# Patient Record
Sex: Female | Born: 2000 | Race: Black or African American | Hispanic: No | Marital: Single | Smoking: Never smoker
Health system: Southern US, Community
[De-identification: ages and names within clinical notes are randomized; demographics above are authoritative.]

## PROBLEM LIST (undated history)

## (undated) DIAGNOSIS — L732 Hidradenitis suppurativa: Secondary | ICD-10-CM

## (undated) DIAGNOSIS — F32A Depression, unspecified: Secondary | ICD-10-CM

## (undated) HISTORY — DX: Depression, unspecified: F32.A

## (undated) HISTORY — DX: Hidradenitis suppurativa: L73.2

---
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
## 2022-10-22 ENCOUNTER — Ambulatory Visit: Payer: 59 | Admitting: Nurse Practitioner

## 2022-10-22 ENCOUNTER — Encounter: Payer: Self-pay | Admitting: Nurse Practitioner

## 2022-10-22 VITALS — BP 114/76 | HR 83 | Temp 98.9°F | Resp 14 | Ht 63.5 in | Wt 178.4 lb

## 2022-10-22 DIAGNOSIS — F331 Major depressive disorder, recurrent, moderate: Secondary | ICD-10-CM | POA: Insufficient documentation

## 2022-10-22 DIAGNOSIS — N946 Dysmenorrhea, unspecified: Secondary | ICD-10-CM

## 2022-10-22 DIAGNOSIS — Z13220 Encounter for screening for lipoid disorders: Secondary | ICD-10-CM

## 2022-10-22 DIAGNOSIS — Z842 Family history of other diseases of the genitourinary system: Secondary | ICD-10-CM | POA: Insufficient documentation

## 2022-10-22 DIAGNOSIS — N926 Irregular menstruation, unspecified: Secondary | ICD-10-CM

## 2022-10-22 DIAGNOSIS — Z114 Encounter for screening for human immunodeficiency virus [HIV]: Secondary | ICD-10-CM

## 2022-10-22 DIAGNOSIS — F419 Anxiety disorder, unspecified: Secondary | ICD-10-CM | POA: Diagnosis not present

## 2022-10-22 DIAGNOSIS — L732 Hidradenitis suppurativa: Secondary | ICD-10-CM | POA: Insufficient documentation

## 2022-10-22 DIAGNOSIS — Z7689 Persons encountering health services in other specified circumstances: Secondary | ICD-10-CM

## 2022-10-22 DIAGNOSIS — Z131 Encounter for screening for diabetes mellitus: Secondary | ICD-10-CM | POA: Diagnosis not present

## 2022-10-22 DIAGNOSIS — Z1159 Encounter for screening for other viral diseases: Secondary | ICD-10-CM

## 2022-10-22 LAB — CBC WITH DIFFERENTIAL/PLATELET
Eosinophils Relative: 1.9 %
MCH: 26.6 pg — ABNORMAL LOW (ref 27.0–33.0)
MCHC: 32.3 g/dL (ref 32.0–36.0)
MCV: 82.5 fL (ref 80.0–100.0)
Monocytes Relative: 7.3 %
RBC: 5.48 10*6/uL — ABNORMAL HIGH (ref 3.80–5.10)
Total Lymphocyte: 29 %

## 2022-10-22 MED ORDER — ESCITALOPRAM OXALATE 10 MG PO TABS: 10.0000 mg | ORAL_TABLET | Freq: Every day | ORAL | 0 refills | Status: DC

## 2022-10-22 NOTE — Assessment & Plan Note (Signed)
Getting labs to start. Can discuss OCP or gyn referral after results.

## 2022-10-22 NOTE — Assessment & Plan Note (Signed)
Start lexapro 10 mg daily,  follow up in 4 weeks,  referral placed to psychiatry 

## 2022-10-22 NOTE — Assessment & Plan Note (Signed)
Getting labs to start. Can discuss OCP or gyn referral after results.  

## 2022-10-22 NOTE — Assessment & Plan Note (Signed)
Start lexapro 10 mg daily,  follow up in 4 weeks,  referral placed to psychiatry

## 2022-10-22 NOTE — Progress Notes (Signed)
BP 114/76   Pulse 83   Temp 98.9 F (37.2 C)   Resp 14   Ht 5' 3.5" (1.613 m)   Wt 178 lb 6.4 oz (80.9 kg)   LMP 10/17/2022   SpO2 100%   BMI 31.11 kg/m    Subjective:    Patient ID: Jessica Guerrero, female    DOB: October 21, 2000, 22 y.o.   MRN: [id number]  HPI: Jessica Guerrero is a 22 y.o. female  Chief Complaint  Patient presents with   Establish Care   Anxiety   Establish care: her last physical was a while ago.  Medical history includes depression, anxiety, HS.  Family history includes PCOS, HTN, DM, HS, COPD, breast cancer, depression, bipolar autism.  Health maintenance due for labs.   Depression/anxiety:  she says she has struggled for years.  She has never been on medication.  She would like to do counseling and medication.  She says she has a family history of mental disorders and would like to be evaluated.  Will place referral to psychiatry.  Will also trial lexapro 10mg  daily.  Follow up in 4 weeks.     10/22/2022    1:25 PM  Depression screen PHQ 2/9  Decreased Interest 3  Down, Depressed, Hopeless 2  PHQ - 2 Score 5  Altered sleeping 2  Tired, decreased energy 2  Change in appetite 3  Feeling bad or failure about yourself  2  Trouble concentrating 3  Moving slowly or fidgety/restless 1  Suicidal thoughts 0  PHQ-9 Score 18  Difficult doing work/chores Somewhat difficult       10/22/2022    1:27 PM  GAD 7 : Generalized Anxiety Score  Nervous, Anxious, on Edge 3  Control/stop worrying 2  Worry too much - different things 2  Trouble relaxing 2  Restless 1  Easily annoyed or irritable 3  Afraid - awful might happen 2  Total GAD 7 Score 15  Anxiety Difficulty Somewhat difficult   HS:  she has a family history of HS.  She says that she gets recurrent abscesses in her groin and arm pits.  Will refer to dermatology.    Family history of PCOS/ irregular periods/dysmenorrhea: she says she has irregular and painful periods.  [facility]; 10/17/2022. She  has a family history of PCOS and would like to be tested for it. Will get labs.    Relevant past medical, surgical, family and social history reviewed and updated as indicated. Interim medical history since our last visit reviewed. Allergies and medications reviewed and updated.  Review of Systems  Constitutional: Negative for fever or weight change.  Respiratory: Negative for cough and shortness of breath.   Cardiovascular: Negative for chest pain or palpitations.  Gastrointestinal: Negative for abdominal pain, no bowel changes.  Musculoskeletal: Negative for gait problem or joint swelling.  Skin: Negative for rash.  Neurological: Negative for dizziness or headache.  No other specific complaints in a complete review of systems (except as listed in HPI above).      Objective:    BP 114/76   Pulse 83   Temp 98.9 F (37.2 C)   Resp 14   Ht 5' 3.5" (1.613 m)   Wt 178 lb 6.4 oz (80.9 kg)   LMP 10/17/2022   SpO2 100%   BMI 31.11 kg/m   Wt Readings from Last 3 Encounters:  10/22/22 178 lb 6.4 oz (80.9 kg) (94 %, Z= 1.58)*   * Growth percentiles are based on CDC (Girls,  2-20 Years) data.    Physical Exam  Constitutional: Patient appears well-developed and well-nourished. Obese  No distress.  HEENT: head atraumatic, normocephalic, pupils equal and reactive to light, neck supple Cardiovascular: Normal rate, regular rhythm and normal heart sounds.  No murmur heard. No BLE edema. Pulmonary/Chest: Effort normal and breath sounds normal. No respiratory distress. Abdominal: Soft.  There is no tenderness. Psychiatric: Patient has a normal mood and affect. behavior is normal. Judgment and thought content normal.  Results for orders placed or performed during the hospital encounter of 11/17/08  Rapid strep screen  Result Value Ref Range   Streptococcus, Group A Screen (Direct) NEGATIVE NEGATIVE  Urine culture   Specimen: Urine, Random  Result Value Ref Range   Specimen Description  URINE, RANDOM    Special Requests NONE    Colony Count 25,000 COLONIES/ML    Culture VIRIDANS STREPTOCOCCUS    Report Status 11/20/2008 FINAL   Urinalysis, Routine w reflex microscopic  Result Value Ref Range   Color, Urine YELLOW YELLOW   APPearance CLEAR CLEAR   Specific Gravity, Urine 1.036 (H) 1.005 - 1.030   pH 5.5 5.0 - 8.0   Glucose, UA NEGATIVE NEGATIVE mg/dL   Hgb urine dipstick NEGATIVE NEGATIVE   Bilirubin Urine SMALL (A) NEGATIVE   Ketones, ur 40 (A) NEGATIVE mg/dL   Protein, ur 30 (A) NEGATIVE mg/dL   Urobilinogen, UA 0.2 0.0 - 1.0 mg/dL   Nitrite NEGATIVE NEGATIVE   Leukocytes, UA NEGATIVE NEGATIVE  Urine microscopic-add on  Result Value Ref Range   WBC, UA 0-2 <3 WBC/hpf   RBC / HPF 0-2 <3 RBC/hpf   Bacteria, UA RARE RARE   Urine-Other MUCOUS PRESENT       Assessment & Plan:   Problem List Items Addressed This Visit       Musculoskeletal and Integument   Hidradenitis suppurativa - Primary    Referral placed to dermatology      Relevant Orders   Ambulatory referral to Dermatology     Genitourinary   Dysmenorrhea    Getting labs to start. Can discuss OCP or gyn referral after results.       Relevant Orders   CBC with Differential/Platelet   TSH   FSH/LH   Prolactin   17-Hydroxyprogesterone     Other   Moderate episode of recurrent major depressive disorder    Start lexapro 10 mg daily,  follow up in 4 weeks,  referral placed to psychiatry      Relevant Medications   escitalopram (LEXAPRO) 10 MG tablet   Other Relevant Orders   Ambulatory referral to Psychiatry   Anxiety    Start lexapro 10 mg daily,  follow up in 4 weeks,  referral placed to psychiatry      Relevant Medications   escitalopram (LEXAPRO) 10 MG tablet   Other Relevant Orders   Ambulatory referral to Psychiatry   Family history of PCOS    Getting labs to start. Can discuss OCP or gyn referral after results.       Relevant Orders   TSH   FSH/LH   Prolactin    17-Hydroxyprogesterone   Irregular periods    Getting labs to start. Can discuss OCP or gyn referral after results.       Relevant Orders   CBC with Differential/Platelet   TSH   FSH/LH   Prolactin   17-Hydroxyprogesterone   Other Visit Diagnoses     Screening for diabetes mellitus  Relevant Orders   COMPLETE METABOLIC PANEL WITH GFR   Hemoglobin A1c   Screening for cholesterol level       Relevant Orders   Lipid panel   Encounter for hepatitis C screening test for low risk patient       Relevant Orders   Hepatitis C antibody   Screening for HIV without presence of risk factors       Relevant Orders   HIV Antibody (routine testing w rflx)   Encounter to establish care       schedule cpe        Follow up plan: Return for cpe.

## 2022-10-22 NOTE — Assessment & Plan Note (Signed)
Referral placed to dermatology

## 2022-10-23 LAB — FSH/LH
FSH: 11.3 m[IU]/mL
LH: 5.7 m[IU]/mL

## 2022-10-23 LAB — HIV ANTIBODY (ROUTINE TESTING W REFLEX): HIV 1&2 Ab, 4th Generation: NONREACTIVE

## 2022-10-23 LAB — LIPID PANEL
Cholesterol: 175 mg/dL — ABNORMAL HIGH (ref <170)
LDL Cholesterol (Calc): 97 mg/dL (calc) (ref <110)
Non-HDL Cholesterol (Calc): 113 mg/dL (calc) (ref <120)
Triglycerides: 73 mg/dL (ref <90)

## 2022-10-23 LAB — COMPLETE METABOLIC PANEL WITH GFR
AG Ratio: 1.7 (calc) (ref 1.0–2.5)
Albumin: 4.7 g/dL (ref 3.6–5.1)
Alkaline phosphatase (APISO): 45 U/L (ref 36–128)
Chloride: 106 mmol/L (ref 98–110)
Globulin: 2.8 g/dL (calc) (ref 2.0–3.8)
Glucose, Bld: 71 mg/dL (ref 65–99)
Sodium: 139 mmol/L (ref 135–146)

## 2022-10-23 LAB — CBC WITH DIFFERENTIAL/PLATELET
Absolute Monocytes: 475 cells/uL (ref 200–950)
Basophils Absolute: 72 cells/uL (ref 0–200)
Basophils Relative: 1.1 %
Eosinophils Absolute: 124 cells/uL (ref 15–500)
HCT: 45.2 % — ABNORMAL HIGH (ref 35.0–45.0)
Neutro Abs: 3946 cells/uL (ref 1500–7800)
Platelets: 359 10*3/uL (ref 140–400)
WBC: 6.5 10*3/uL (ref 3.8–10.8)

## 2022-10-23 LAB — PROLACTIN: Prolactin: 12.3 ng/mL

## 2022-10-23 LAB — HEMOGLOBIN A1C: eAG (mmol/L): 5.7 mmol/L

## 2022-10-23 LAB — HEPATITIS C ANTIBODY: Hepatitis C Ab: NONREACTIVE

## 2022-10-26 LAB — CBC WITH DIFFERENTIAL/PLATELET
Hemoglobin: 14.6 g/dL (ref 11.7–15.5)
Lymphs Abs: 1885 cells/uL (ref 850–3900)
MPV: 11.3 fL (ref 7.5–12.5)
Neutrophils Relative %: 60.7 %
RDW: 12.2 % (ref 11.0–15.0)

## 2022-10-26 LAB — LIPID PANEL
HDL: 62 mg/dL (ref >45)
Total CHOL/HDL Ratio: 2.8 (calc) (ref <5.0)

## 2022-10-26 LAB — COMPLETE METABOLIC PANEL WITH GFR
ALT: 15 U/L (ref 5–32)
AST: 16 U/L (ref 12–32)
BUN: 11 mg/dL (ref 7–20)
CO2: 24 mmol/L (ref 20–32)
Calcium: 9.8 mg/dL (ref 8.9–10.4)
Creat: 0.78 mg/dL (ref 0.50–0.96)
Potassium: 4.2 mmol/L (ref 3.8–5.1)
Total Bilirubin: 0.4 mg/dL (ref 0.2–1.1)
Total Protein: 7.5 g/dL (ref 6.3–8.2)
eGFR: 112 mL/min/{1.73_m2} (ref >=60)

## 2022-10-26 LAB — 17-HYDROXYPROGESTERONE: 17-OH-Progesterone, LC/MS/MS: 27 ng/dL

## 2022-10-26 LAB — HEMOGLOBIN A1C
Hgb A1c MFr Bld: 5.2 % of total Hgb (ref <5.7)
Mean Plasma Glucose: 103 mg/dL

## 2022-10-26 LAB — TSH: TSH: 0.8 mIU/L

## 2022-10-27 ENCOUNTER — Telehealth: Payer: Self-pay | Admitting: *Deleted

## 2022-10-27 NOTE — Telephone Encounter (Signed)
  Chief Complaint: Results Symptoms: NA Frequency: NA Pertinent Negatives: Patient denies NA Disposition: [] ED /[] Urgent Care (no appt availability in office) / Appointment(In office/virtual)/  [facility] [facility]/ Home Care/ Refused Recommended Disposition /[] [place] [facility]/  Follow-u g.   "[place], counts, kidney and liver function are normal.  Your cholesterol looks good.  Your A1c is 6 for diabetes is normal.  Your screening for hepatitis C and HIV were negative.  Your thyroid is normal all your hormones came back normal. "

## 2022-11-01 NOTE — Progress Notes (Unsigned)
Name: Jessica Guerrero   MRN: [id number]    DOB: 2001-03-23   Date:11/02/2022       Progress Note  Subjective  Chief Complaint  Chief Complaint  Patient presents with   Annual Exam    HPI  Patient presents for annual CPE.  Diet: she says her diet is dependent on time.  She does not eat red meat, fish, or pork.  Exercise: she says that she is not very active.  She does have a physical job.  Discussed increasing physical activity to 150 min a week.   Sleep: 6 hours Last dental exam:unknown Last eye exam: unknown  Flowsheet Row Office Visit from 11/02/20[facility] in [facility]  AUDIT-C Score 0      Depression: Phq 9 is  positive, she was recently started on Lexapro.     11/02/2022    1:33 PM 10/22/2022    1:25 PM  Depression screen PHQ 2/9  Decreased Interest 2 3  Down, Depressed, Hopeless 2 2  PHQ - 2 Score 4 5  Altered sleeping 3 2  Tired, decreased energy 3 2  Change in appetite 2 3  Feeling bad or failure about yourself  1 2  Trouble concentrating 3 3  Moving slowly or fidgety/restless 0 1  Suicidal thoughts 0 0  PHQ-9 Score 16 18  Difficult doing work/chores Very difficult Somewhat difficult   Hypertension: BP Readings from Last 3 Encounters:  11/02/22 120/68  10/22/22 114/76   Obesity: Wt Readings from Last 3 Encounters:  11/02/22 183 lb 4.8 oz (83.1 kg) (95 %, Z= 1.67)*  10/22/22 178 lb 6.4 oz (80.9 kg) (94 %, Z= 1.58)*   * Growth percentiles are based on CDC (Girls, 2-20 Years) data.   BMI Readings from Last 3 Encounters:  11/02/22 31.96 kg/m (95 %, Z= 1.67)*  10/22/22 31.11 kg/m (95 %, Z= 1.62)*   * Growth percentiles are based on CDC (Girls, 2-20 Years) data.     Vaccines:  HPV: up to at age 37 , ask insurance if age between 63-45  Shingrix: 26-64 yo and ask insurance if covered when patient above 57 yo Pneumonia:  educated and discussed with patient. Flu:  educated and discussed with patient.  Hep C Screening:  10/22/2022 STD testing and prevention (HIV/chl/gon/syphilis): 10/22/2022 Intimate partner violence:none Sexual History :not currently sexually active Menstrual History/LMP/Abnormal Bleeding: irregular periods.  Incontinence Symptoms: none  Breast cancer:  - Last Mammogram: no concerns,does not qualify - BRCA gene screening: unknown,  grandmother on moms side  Osteoporosis: Discussed high calcium and vitamin D supplementation, weight bearing exercises  Cervical cancer screening: none, not due  Skin cancer: Discussed monitoring for atypical lesions  Colorectal cancer: no concerns, does not qualify   Lung cancer:   Low Dose CT Chest recommended if Age 28-80 years, 20 pack-year currently smoking OR have quit w/in 15years. Patient does not qualify.   ECG: none  Advanced Care Planning: A voluntary discussion about advance care planning including the explanation and discussion of advance directives.  Discussed health care proxy and Living will, and the patient was able to identify a health care proxy as thinking about it.  Patient does not have a living will at present time. If patient does have living will, I have requested they bring this to the clinic to be scanned in to their chart.  Lipids: Lab Results  Component Value Date   CHOL 175 (H) 10/22/2022   Lab Results  Component Value Date  HDL 62 10/22/2022   Lab Results  Component Value Date   LDLCALC 97 10/22/2022   Lab Results  Component Value Date   TRIG 73 10/22/2022   Lab Results  Component Value Date   CHOLHDL 2.8 10/22/2022    Glucose: Glucose, Bld  Date Value Ref Range Status  10/22/2022 71 65 - 99 mg/dL Final    Comment:    .            Fasting reference interval .     Patient Active Problem List   Diagnosis Date Noted   Hidradenitis suppurativa 10/22/2022   Moderate episode of recurrent major depressive disorder (HCC) 10/22/2022   Anxiety 10/22/2022   Family history of PCOS 10/22/2022   Dysmenorrhea  10/22/2022   Irregular periods 10/22/2022    No past surgical history on file.  Family History  Problem Relation Age of Onset   Diabetes Mother    Bipolar disorder Mother    Cirrhosis Mother    Autism Brother    Diabetes Maternal Grandmother    Hyperlipidemia Maternal Grandmother    COPD Paternal Grandmother    Diabetes Paternal Grandmother    Hyperlipidemia Paternal Grandmother    Heart failure Paternal Grandmother     Social History   Socioeconomic History   Marital status: Single    Spouse name: Not on file   Number of children: Not on file   Years of education: Not on file   Highest education level: Not on file  Occupational History   Not on file  Tobacco Use   Smoking status: Never   Smokeless tobacco: Never  Vaping Use   Vaping Use: Every day   Substances: Nicotine, Flavoring  Substance and Sexual Activity   Alcohol use: Never   Drug use: Yes    Types: Marijuana   Sexual activity: Not Currently  Other Topics Concern   Not on file  Social History Narrative   Not on file   Social Determinants of Health   Financial Resource Strain: Low Risk  (11/02/2022)   Overall Financial Resource Strain (CARDIA)    Difficulty of Paying Living Expenses: Not hard at all  Food Insecurity: No Food Insecurity (11/02/2022)   Hunger Vital Sign    Worried About Running Out of Food in the Last Year: Never true    Ran Out of Food in the Last Year: Never true  Transportation Needs: No Transportation Needs (11/02/2022)   PRAPARE - Administrator, Civil Service (Medical): No    Lack of Transportation (Non-Medical): No  Physical Activity: Inactive (11/02/2022)   Exercise Vital Sign    Days of Exercise per Week: 0 days    Minutes of Exercise per Session: 0 min  Stress: Stress Concern Present (11/02/2022)   [employer] of Occupational Health - Occupational Stress Questionnaire    Feeling of Stress : Very much  Social Connections: Socially Isolated (11/02/2022)    Social Connection and Isolation Panel [NHANES]    Frequency of Communication with Friends and Family: More than three times a week    Frequency of Social Gatherings with Friends and Family: More than three times a week    Attends Religious Services: Never    Database administrator or Organizations: No    Attends Banker Meetings: Never    Marital Status: Separated  Intimate Partner Violence: Not At Risk (11/02/2022)   Humiliation, Afraid, Rape, and Kick questionnaire    Fear of Current or Ex-Partner: No  Emotionally Abused: No    Physically Abused: No    Sexually Abused: No     Current Outpatient Medications:    escitalopram (LEXAPRO) 10 MG tablet, Take 1 tablet (10 mg total) by mouth daily., Disp: 30 tablet, Rfl: 0  No Known Allergies   ROS  Constitutional: Negative for fever or weight change.  Respiratory: Negative for cough and shortness of breath.   Cardiovascular: Negative for chest pain or palpitations.  Gastrointestinal: Negative for abdominal pain, no bowel changes.  Musculoskeletal: Negative for gait problem or joint swelling.  Skin: Negative for rash.  Neurological: Negative for dizziness or headache.  No other specific complaints in a complete review of systems (except as listed in HPI above).   Objective  Vitals:   11/02/22 1320  BP: 120/68  Pulse: 89  Resp: 18  Temp: 98.2 F (36.8 C)  TempSrc: Oral  SpO2: 98%  Weight: 183 lb 4.8 oz (83.1 kg)  Height: 5' 3.5" (1.613 m)    Body mass index is 31.96 kg/m.  Physical Exam Constitutional: Patient appears well-developed and well-nourished. No distress.  HENT: Head: Normocephalic and atraumatic. Ears: B TMs ok, no erythema or effusion; Nose: Nose normal. Mouth/Throat: Oropharynx is clear and moist. No oropharyngeal exudate.  Eyes: Conjunctivae and EOM are normal. Pupils are equal, round, and reactive to light. No scleral icterus.  Neck: Normal range of motion. Neck supple. No JVD present. No  thyromegaly present.  Cardiovascular: Normal rate, regular rhythm and normal heart sounds.  No murmur heard. No BLE edema. Pulmonary/Chest: Effort normal and breath sounds normal. No respiratory distress. Abdominal: Soft. Bowel sounds are normal, no distension. There is no tenderness. no masses Breast: no lumps or masses, no nipple discharge or rashes Musculoskeletal: Normal range of motion, no joint effusions. No gross deformities Neurological: he is alert and oriented to person, place, and time. No cranial nerve deficit. Coordination, balance, strength, speech and gait are normal.  Skin: Skin is warm and dry. No rash noted. No erythema.  Psychiatric: Patient has a normal mood and affect. behavior is normal. Judgment and thought content normal.       Fall Risk:    11/02/2022    1:32 PM 10/22/2022    1:25 PM  Fall Risk   Falls in the past year? 0 0  Number falls in past yr: 0 0  Injury with Fall? 0 0     Functional Status Survey: Is the patient deaf or have difficulty hearing?: No Does the patient have difficulty seeing, even when wearing glasses/contacts?: No Does the patient have difficulty concentrating, remembering, or making decisions?: No Does the patient have difficulty walking or climbing stairs?: No Does the patient have difficulty dressing or bathing?: No Does the patient have difficulty doing errands alone such as visiting a doctor's office or shopping?: No   Assessment & Plan  1. Annual physical exam Increase physical activity to 150 min a week Try and eat well balanced diet Recent labs were good  Follow up for depression/anxiety  -USPSTF grade A and B recommendations reviewed with patient; age-appropriate recommendations, preventive care, screening tests, etc discussed and encouraged; healthy living encouraged; see AVS for patient education given to patient -Discussed importance of 150 minutes of physical activity weekly, eat two servings of fish weekly, eat one  serving of tree nuts ( cashews, pistachios, pecans, almonds.Marland Kitchen) every other day, eat 6 servings of fruit/vegetables daily and drink plenty of water and avoid sweet beverages.

## 2022-11-02 ENCOUNTER — Encounter: Payer: Self-pay | Admitting: Nurse Practitioner

## 2022-11-02 ENCOUNTER — Ambulatory Visit (INDEPENDENT_AMBULATORY_CARE_PROVIDER_SITE_OTHER): Payer: 59 | Admitting: Nurse Practitioner

## 2022-11-02 ENCOUNTER — Other Ambulatory Visit: Payer: Self-pay

## 2022-11-02 VITALS — BP 120/68 | HR 89 | Temp 98.2°F | Resp 18 | Ht 63.5 in | Wt 183.3 lb

## 2022-11-02 DIAGNOSIS — Z0000 Encounter for general adult medical examination without abnormal findings: Secondary | ICD-10-CM | POA: Diagnosis not present

## 2022-11-16 ENCOUNTER — Ambulatory Visit: Payer: 59 | Admitting: Nurse Practitioner

## 2022-11-16 ENCOUNTER — Encounter: Payer: Self-pay | Admitting: Nurse Practitioner

## 2022-11-16 VITALS — BP 114/72 | HR 96 | Temp 98.4°F | Resp 14 | Ht 63.5 in | Wt 185.2 lb

## 2022-11-16 DIAGNOSIS — F331 Major depressive disorder, recurrent, moderate: Secondary | ICD-10-CM

## 2022-11-16 DIAGNOSIS — F419 Anxiety disorder, unspecified: Secondary | ICD-10-CM

## 2022-11-16 MED ORDER — ESCITALOPRAM OXALATE 20 MG PO TABS: 20.0000 mg | ORAL_TABLET | Freq: Every day | ORAL | 0 refills | Status: DC

## 2022-11-16 MED ORDER — BUSPIRONE HCL 5 MG PO TABS: 5.0000 mg | ORAL_TABLET | Freq: Three times a day (TID) | ORAL | 0 refills | Status: DC | PRN

## 2022-11-16 NOTE — Assessment & Plan Note (Signed)
Increase lexapro to 20 mg daily,  start buspar 5 mg TID as needed, has appointment with psychiatry next month.

## 2022-11-16 NOTE — Assessment & Plan Note (Signed)
Increase lexapro to 20 mg daily,  start buspar 5 mg TID as needed, has appointment with psychiatry next month.  

## 2022-11-16 NOTE — Progress Notes (Signed)
BP 114/72 (BP Location: Right Arm, Patient Position: Sitting, Cuff Size: Normal)   Pulse 96   Temp 98.4 F (36.9 C) (Oral)   Resp 14   Ht 5' 3.5" (1.613 m) Comment: per chart  Wt 185 lb 3.2 oz (84 kg)   LMP 10/17/2022   SpO2 98%   BMI 32.29 kg/m    Subjective:    Patient ID: Jessica Guerrero, female    DOB: 2000-08-31, 22 y.o.   MRN: [id number]  HPI: Jessica Guerrero is a 22 y.o. female  Chief Complaint  Patient presents with   Follow-up   Depression/anxiety:  she was started on Lexapro 10 mg daily. Referral to psychiatry was placed at an earlier appointment, she has an appointment on 12/29/2022.  She reports that she has noticed a difference, she says she is not where she wants to be and would like to increase the dose.  Her PHQ9 and GAD scores are improving but not at goal. Will increase the dose to 20 mg daily.  She also has noticed that her anxiety has been worse or more noticeable. Will start buspar 5 mg TID as needed.     11/16/2022    2:23 PM 11/02/2022    1:33 PM 10/22/2022    1:25 PM  Depression screen PHQ 2/9  Decreased Interest 2 2 3   Down, Depressed, Hopeless 2 2 2   PHQ - 2 Score 4 4 5   Altered sleeping 1 3 2   Tired, decreased energy 2 3 2   Change in appetite 3 2 3   Feeling bad or failure about yourself  1 1 2   Trouble concentrating 1 3 3   Moving slowly or fidgety/restless 0 0 1  Suicidal thoughts 0 0 0  PHQ-9 Score 12 16 18   Difficult doing work/chores Somewhat difficult Very difficult Somewhat difficult       11/16/2022    2:23 PM 11/02/2022    1:35 PM 10/22/2022    1:27 PM  GAD 7 : Generalized Anxiety Score  Nervous, Anxious, on Edge 2 2 3   Control/stop worrying 1 2 2   Worry too much - different things 2 3 2   Trouble relaxing 2 1 2   Restless 1 1 1   Easily annoyed or irritable 2 3 3   Afraid - awful might happen 0 1 2  Total GAD 7 Score 10 13 15   Anxiety Difficulty Somewhat difficult Very difficult Somewhat difficult     Relevant past  medical, surgical, family and social history reviewed and updated as indicated. Interim medical history since our last visit reviewed. Allergies and medications reviewed and updated.  Review of Systems  Constitutional: Negative for fever or weight change.  Respiratory: Negative for cough and shortness of breath.   Cardiovascular: Negative for chest pain or palpitations.  Gastrointestinal: Negative for abdominal pain, no bowel changes.  Musculoskeletal: Negative for gait problem or joint swelling.  Skin: Negative for rash.  Neurological: Negative for dizziness or headache.  No other specific complaints in a complete review of systems (except as listed in HPI above).      Objective:    BP 114/72 (BP Location: Right Arm, Patient Position: Sitting, Cuff Size: Normal)   Pulse 96   Temp 98.4 F (36.9 C) (Oral)   Resp 14   Ht 5' 3.5" (1.613 m) Comment: per chart  Wt 185 lb 3.2 oz (84 kg)   LMP 10/17/2022   SpO2 98%   BMI 32.29 kg/m   Wt Readings from Last 3 Encounters:  11/16/22 185  lb 3.2 oz (84 kg) (96 %, Z= 1.70)*  11/02/22 183 lb 4.8 oz (83.1 kg) (95 %, Z= 1.67)*  10/22/22 178 lb 6.4 oz (80.9 kg) (94 %, Z= 1.58)*   * Growth percentiles are based on CDC (Girls, 2-20 Years) data.    Physical Exam  Constitutional: Patient appears well-developed and well-nourished. Obese  No distress.  HEENT: head atraumatic, normocephalic, pupils equal and reactive to light, neck supple, throat within normal limits Cardiovascular: Normal rate, regular rhythm and normal heart sounds.  No murmur heard. No BLE edema. Pulmonary/Chest: Effort normal and breath sounds normal. No respiratory distress. Abdominal: Soft.  There is no tenderness. Psychiatric: Patient has a normal mood and affect. behavior is normal. Judgment and thought content normal.   Assessment & Plan:   Problem List Items Addressed This Visit       Other   Moderate episode of recurrent major depressive disorder (HCC) - Primary     Increase lexapro to 20 mg daily,  start buspar 5 mg TID as needed, has appointment with psychiatry next month.       Relevant Medications   escitalopram (LEXAPRO) 20 MG tablet   busPIRone (BUSPAR) 5 MG tablet   Anxiety    Increase lexapro to 20 mg daily,  start buspar 5 mg TID as needed, has appointment with psychiatry next month.       Relevant Medications   escitalopram (LEXAPRO) 20 MG tablet   busPIRone (BUSPAR) 5 MG tablet     Follow up plan: Return in about 4 weeks (around 12/14/2022) for follow up.

## 2022-12-13 NOTE — Progress Notes (Unsigned)
   There were no vitals taken for this visit.   Subjective:    Patient ID: Jessica Guerrero, female    DOB: January 13, 2001, 22 y.o.   MRN: [id number]  HPI: Jessica Guerrero is a 22 y.o. female  No chief complaint on file.  Depression/anxiety Medication lexapro 20 mg daily, buspar 5 mg TID Compliant *** Side effects *** PHQ9 *** GAD *** Therapy ***  Psychiatry appointment 12/29/2022      11/16/2022    2:23 PM 11/02/2022    1:33 PM 10/22/2022    1:25 PM  Depression screen PHQ 2/9  Decreased Interest 2 2 3   Down, Depressed, Hopeless 2 2 2   PHQ - 2 Score 4 4 5   Altered sleeping 1 3 2   Tired, decreased energy 2 3 2   Change in appetite 3 2 3   Feeling bad or failure about yourself  1 1 2   Trouble concentrating 1 3 3   Moving slowly or fidgety/restless 0 0 1  Suicidal thoughts 0 0 0  PHQ-9 Score 12 16 18   Difficult doing work/chores Somewhat difficult Very difficult Somewhat difficult       11/16/2022    2:23 PM 11/02/2022    1:35 PM 10/22/2022    1:27 PM  GAD 7 : Generalized Anxiety Score  Nervous, Anxious, on Edge 2 2 3   Control/stop worrying 1 2 2   Worry too much - different things 2 3 2   Trouble relaxing 2 1 2   Restless 1 1 1   Easily annoyed or irritable 2 3 3   Afraid - awful might happen 0 1 2  Total GAD 7 Score 10 13 15   Anxiety Difficulty Somewhat difficult Very difficult Somewhat difficult     Relevant past medical, surgical, family and social history reviewed and updated as indicated. Interim medical history since our last visit reviewed. Allergies and medications reviewed and updated.  Review of Systems  Constitutional: Negative for fever or weight change.  Respiratory: Negative for cough and shortness of breath.   Cardiovascular: Negative for chest pain or palpitations.  Gastrointestinal: Negative for abdominal pain, no bowel changes.  Musculoskeletal: Negative for gait problem or joint swelling.  Skin: Negative for rash.  Neurological: Negative  for dizziness or headache.  No other specific complaints in a complete review of systems (except as listed in HPI above).      Objective:    There were no vitals taken for this visit.  Wt Readings from Last 3 Encounters:  11/16/22 185 lb 3.2 oz (84 kg) (96 %, Z= 1.70)*  11/02/22 183 lb 4.8 oz (83.1 kg) (95 %, Z= 1.67)*  10/22/22 178 lb 6.4 oz (80.9 kg) (94 %, Z= 1.58)*   * Growth percentiles are based on CDC (Girls, 2-20 Years) data.    Physical Exam  Constitutional: Patient appears well-developed and well-nourished. Obese  No distress.  HEENT: head atraumatic, normocephalic, pupils equal and reactive to light, neck supple, throat within normal limits Cardiovascular: Normal rate, regular rhythm and normal heart sounds.  No murmur heard. No BLE edema. Pulmonary/Chest: Effort normal and breath sounds normal. No respiratory distress. Abdominal: Soft.  There is no tenderness. Psychiatric: Patient has a normal mood and affect. behavior is normal. Judgment and thought content normal.   Assessment & Plan:   Problem List Items Addressed This Visit   None    Follow up plan: No follow-ups on file.

## 2022-12-14 ENCOUNTER — Ambulatory Visit: Payer: 59 | Admitting: Nurse Practitioner

## 2022-12-14 ENCOUNTER — Encounter: Payer: Self-pay | Admitting: Nurse Practitioner

## 2022-12-14 ENCOUNTER — Other Ambulatory Visit: Payer: Self-pay

## 2022-12-14 VITALS — BP 122/84 | HR 72 | Temp 97.9°F | Resp 18 | Ht 63.5 in | Wt 187.7 lb

## 2022-12-14 DIAGNOSIS — F331 Major depressive disorder, recurrent, moderate: Secondary | ICD-10-CM

## 2022-12-14 DIAGNOSIS — F419 Anxiety disorder, unspecified: Secondary | ICD-10-CM | POA: Diagnosis not present

## 2022-12-14 MED ORDER — ESCITALOPRAM OXALATE 20 MG PO TABS: 20.0000 mg | ORAL_TABLET | Freq: Every day | ORAL | 0 refills | Status: DC

## 2022-12-14 NOTE — Assessment & Plan Note (Signed)
Continue lexapro 20 mg daily, can increase buspar to 10 mg .  Follow up with psychiatry.

## 2022-12-14 NOTE — Assessment & Plan Note (Signed)
Continue lexapro 20 mg daily,  follow up with psychiatry.

## 2022-12-22 ENCOUNTER — Telehealth: Payer: Self-pay | Admitting: Psychiatry

## 2022-12-22 NOTE — Telephone Encounter (Signed)
Provider out of office. On 6/19 message left on her answering machine to call and reschedule, mychart message sent also

## 2022-12-29 ENCOUNTER — Ambulatory Visit: Payer: Self-pay | Admitting: Psychiatry

## 2023-01-07 ENCOUNTER — Encounter: Payer: Self-pay | Admitting: Psychiatry

## 2023-01-07 ENCOUNTER — Telehealth (INDEPENDENT_AMBULATORY_CARE_PROVIDER_SITE_OTHER): Payer: Self-pay | Admitting: Psychiatry

## 2023-01-07 DIAGNOSIS — Z79899 Other long term (current) drug therapy: Secondary | ICD-10-CM

## 2023-01-07 DIAGNOSIS — F1290 Cannabis use, unspecified, uncomplicated: Secondary | ICD-10-CM

## 2023-01-07 DIAGNOSIS — F39 Unspecified mood [affective] disorder: Secondary | ICD-10-CM

## 2023-01-07 NOTE — Patient Instructions (Addendum)
[email or web address]  www.psychologytoday  [employer] [email or web address] [address] [place], [place] [address]   [phone]  [facility], [email or web address] [address] [place], [place] [address]  [phone]   [facility] - [phone]  [facility] - [phone]  [facility] - [phone] counseling [phone]  [facility] [phone]   [email or web address] this clinician can offer telehealth and has a sliding scale option  [email or web address] this group also offers sliding scale rates and is based out of [place]   [employer] [facility] has interns who offer sliding scale rates and some of the full time clinicians do, as well. You complete their contact form on their website and the referrals coordinator will help to get connected to someone   Medicaid below :  [facility] [address] [place], [place] [address]  [phone]    [employer] [address] [place], [place] [address]  [phone]    [facility] [address] [place], [place] [address]  [phone]     Lamotrigine Tablets What is this medication? LAMOTRIGINE (la MOE Patrecia Pace) prevents and controls seizures in people with epilepsy. It may also be used to treat bipolar disorder. It works by calming overactive nerves in your body. This medicine may be used for other purposes; ask your health care provider or pharmacist if you have questions. COMMON BRAND NAME(S): Lamictal, Subvenite What should I tell my care team before I take this medication? They need to know if you have any of these conditions: Heart disease History of irregular heartbeat Immune system problems Kidney  disease Liver disease Low levels of folic acid in the blood Lupus Mental health condition Suicidal thoughts, plans, or attempt by you or a family member An unusual or allergic reaction to lamotrigine, other medications, foods, dyes, or preservatives Pregnant or trying to get pregnant Breastfeeding How should I use this medication? Take this medication by mouth with a glass of water. Follow the directions on the prescription label. Do not chew these tablets. If this medication upsets your stomach, take it with food or milk. Take your doses at regular intervals. Do not take your medication more often than directed. A special MedGuide will be given to you by the pharmacist with each new prescription and refill. Be sure to read this information carefully each time. Talk to your care team about the use of this medication in children. While this medication may be prescribed for children as young as 2 years for selected conditions, precautions do apply. Overdosage: If you think you have taken too much of this medicine contact a poison control center or emergency room at once. NOTE: This medicine is only for you. Do not share this medicine with others. What if I miss a dose? If you miss a dose, take it as soon as you can. If it is almost time for your next dose, take only that dose. Do not take double or extra doses. What may interact with this medication? Atazanavir Certain medications for irregular heartbeat Certain medications for seizures, such as carbamazepine, phenobarbital, phenytoin, primidone, or valproic acid Estrogen or progestin hormones Lopinavir Rifampin Ritonavir This list may not describe all possible interactions. Give your health care provider a list of all the medicines, herbs, non-prescription drugs, or dietary supplements you use. Also tell them if you smoke, drink  alcohol, or use illegal drugs. Some items may interact with your medicine. What should I watch for while using  this medication? Visit your care team for regular checks on your progress. If you take this medication for seizures, wear a Medic Alert bracelet or necklace. Carry an identification card with information about your condition, medications, and care team. It is important to take this medication exactly as directed. When first starting treatment, your dose will need to be adjusted slowly. It may take weeks or months before your dose is stable. You should contact your care team if your seizures get worse or if you have any new types of seizures. Do not stop taking this medication unless instructed by your care team. Stopping your medication suddenly can increase your seizures or their severity. This medication may cause serious skin reactions. They can happen weeks to months after starting the medication. Contact your care team right away if you notice fevers or flu-like symptoms with a rash. The rash may be red or purple and then turn into blisters or peeling of the skin. You may also notice a red rash with swelling of the face, lips, or lymph nodes in your neck or under your arms. This medication may affect your coordination, reaction time, or judgment. Do not drive or operate machinery until you know how this medication affects you. Sit up or stand slowly to reduce the risk of dizzy or fainting spells. Drinking alcohol with this medication can increase the risk of these side effects. If you are taking this medication for bipolar disorder, it is important to report any changes in your mood to your care team. If your condition gets worse, you get mentally depressed, feel very hyperactive or manic, have difficulty sleeping, or have thoughts of hurting yourself or committing suicide, you need to get help from your care team right away. If you are a caregiver for someone taking this medication for bipolar disorder, you should also report these behavioral changes right away. The use of this medication may increase the  chance of suicidal thoughts or actions. Pay special attention to how you are responding while on this medication. Your mouth may get dry. Chewing sugarless gum or sucking hard candy and drinking plenty of water may help. Contact your care team if the problem does not go away or is severe. If you become pregnant while using this medication, you may enroll in the [place] American Antiepileptic Drug Pregnancy Registry by calling [phone]. This registry collects information about the safety of antiepileptic medication use during pregnancy. This medication may cause a decrease in folic acid. You should make sure that you get enough folic acid while you are taking this medication. Discuss the foods you eat and the vitamins you take with your care team. What side effects may I notice from receiving this medication? Side effects that you should report to your care team as soon as possible: Allergic reactions--skin rash, itching, hives, swelling of the face, lips, tongue, or throat Change in vision Fever, neck pain or stiffness, sensitivity to light, headache, nausea, vomiting, confusion Heart rhythm changes--fast or irregular heartbeat, dizziness, feeling faint or lightheaded, chest pain, trouble breathing Infection--fever, chills, cough, or sore throat Liver injury--right upper belly pain, loss of appetite, nausea, light-colored stool, dark yellow or brown urine, yellowing skin or eyes, unusual weakness or fatigue Low red blood cell count--unusual weakness or fatigue, dizziness, headache, trouble breathing Rash, fever, and swollen lymph nodes Redness, blistering, peeling or loosening of the skin, including inside  the mouth Thoughts of suicide or self-harm, worsening mood, or feelings of depression Unusual bruising or bleeding Side effects that usually do not require medical attention (report to your care team if they continue or are  bothersome): Diarrhea Dizziness Drowsiness Headache Nausea Stomach pain Tremors or shaking This list may not describe all possible side effects. Call your doctor for medical advice about side effects. You may report side effects to FDA at 1-800-FDA-1088. Where should I keep my medication? Keep out of the reach of children and pets. Store at [employer] [facility] (77 degrees F). Protect from light. Get rid of any unused medication after the expiration date. To get rid of medications that are no longer needed or have expired: Take the medication to a medication take-back program. Check with your pharmacy or law enforcement to find a location. If you cannot return the medication, check the label or package insert to see if the medication should be thrown out in the garbage or flushed down the toilet. If you are not sure, ask your care team. If it is safe to put it in the trash, empty the medication out of the container. Mix the medication with cat litter, dirt, coffee grounds, or other unwanted substance. Seal the mixture in a bag or container. Put it in the trash. NOTE: This sheet is a summary. It may not cover all possible information. If you have questions about this medicine, talk to your doctor, pharmacist, or health care provider.  2024 Elsevier/Gold Standard (2021-12-29 00:00:00)
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
## 2023-01-11 ENCOUNTER — Other Ambulatory Visit: Payer: Self-pay | Admitting: Nurse Practitioner

## 2023-01-11 DIAGNOSIS — F419 Anxiety disorder, unspecified: Secondary | ICD-10-CM

## 2023-01-11 DIAGNOSIS — F331 Major depressive disorder, recurrent, moderate: Secondary | ICD-10-CM

## 2023-01-11 NOTE — Telephone Encounter (Signed)
Requested Prescriptions  Pending Prescriptions Disp Refills   escitalopram (LEXAPRO) 20 MG tablet [Pharmacy Med Name: Escitalopram Oxalate 20 MG Oral Tablet] 90 tablet 0    Sig: Take 1 tablet by mouth once daily     Psychiatry:  Antidepressants - SSRI Passed - 01/11/2023  9:21 AM      Passed - Completed PHQ-2 or PHQ-9 in the last 360 days      Passed - Valid encounter within last 6 months    Recent Outpatient Visits           4 weeks ago Moderate episode of recurrent major depressive disorder [facility])   [facility] Della Goo F, FNP   1 month ago Moderate episode of recurrent major depressive disorder [facility])   [facility] Berniece Salines, FNP   2 months ago Annual physical exam   [facility] Berniece Salines, FNP   2 months ago Hidradenitis suppurativa   [facility] Berniece Salines, [place]

## 2023-01-27 ENCOUNTER — Other Ambulatory Visit: Payer: Self-pay | Admitting: Nurse Practitioner

## 2023-01-27 DIAGNOSIS — F419 Anxiety disorder, unspecified: Secondary | ICD-10-CM

## 2023-01-31 MED ORDER — BUSPIRONE HCL 5 MG PO TABS: 10.0000 mg | ORAL_TABLET | Freq: Every day | ORAL | 1 refills | Status: DC

## 2023-02-17 ENCOUNTER — Encounter: Payer: Self-pay | Admitting: Psychiatry

## 2023-02-17 ENCOUNTER — Ambulatory Visit: Payer: 59 | Admitting: Psychiatry

## 2023-02-17 VITALS — BP 123/85 | HR 67 | Temp 97.8°F | Ht 63.5 in | Wt 199.2 lb

## 2023-02-17 DIAGNOSIS — Z9189 Other specified personal risk factors, not elsewhere classified: Secondary | ICD-10-CM | POA: Insufficient documentation

## 2023-02-17 DIAGNOSIS — F1290 Cannabis use, unspecified, uncomplicated: Secondary | ICD-10-CM | POA: Diagnosis not present

## 2023-02-17 DIAGNOSIS — Z79899 Other long term (current) drug therapy: Secondary | ICD-10-CM

## 2023-02-17 DIAGNOSIS — F39 Unspecified mood [affective] disorder: Secondary | ICD-10-CM | POA: Diagnosis not present

## 2023-02-17 NOTE — Progress Notes (Signed)
BH MD OP Progress Note  02/17/2023 2:30 PM Jessica Guerrero  MRN:  [id number]  Chief Complaint:  Chief Complaint  Patient presents with   Follow-up   Depression   Medication Refill   HPI: Jessica Guerrero is a 22 year old African-American female, employed, single, lives in [place], has a history of episodic mood disorder, hidradenitis suppurativa, was evaluated in office today for follow-up.  Patient's last visit was on 01/07/2023.  At that time patient was referred to CBT as well as was advised to get urine drug screen completed prior to initiation of a mood stabilizer.  Patient today returns reporting that she has been noncompliant with recommendations.  She continues to struggle with depression and mood swings.  Patient reports she has struggled with depression all her life and is very 'self-aware' of that.  Patient reports she lacks self hygiene and feels unmotivated to do anything to take care of herself.  Although she continues to work and reports she is able to take care of her work-related chores.  She also reports she takes care of her grandmother and great-grandmother who needs a lot of help since they have multiple medical problems going on.  She does have 'a dad' however he is not very helpful.  She hence stays busy taking her grandmothers for doctor's appointment and other chores that she needs to do around the house.  Patient reports she continues to have mood swings, irritability, feels frustrated often.  She feels 'stuck', unable to elaborate further.  Patient reports she stopped smoking cannabis at least 2 weeks ago.  Patient denies any suicidality, homicidality or perceptual disturbances.  Patient reports she is agreeable to trial of a mood stabilizer when Abilify and Rexulti which were discussed with her.  She agrees to get labs and EKG completed prior to initiation of this medication.  She is also motivated to actively look for a therapist to start  CBT.  Patient denies any other concerns today.  Visit Diagnosis:    ICD-10-CM   1. Episodic mood disorder (HCC)  F39 Urine drugs of abuse scrn w alc, routine (Ref Lab)    EKG 12-Lead    2. Cannabis use, unspecified, uncomplicated  F12.90 Urine drugs of abuse scrn w alc, routine (Ref Lab)    EKG 12-Lead    3. High risk medication use  Z79.899 Urine drugs of abuse scrn w alc, routine (Ref Lab)    4. At risk for prolonged QT interval syndrome  Z91.89 EKG 12-Lead      Past Psychiatric History: I have reviewed past psychiatric history from my progress note on 01/07/2023.  Past trials of Lexapro, BuSpar.  Past Medical History:  Past Medical History:  Diagnosis Date   Depression    Hydradenitis    History reviewed. No pertinent surgical history.  Family Psychiatric History: I have reviewed family psychiatric history from progress note on 01/07/2023.  Family History:  Family History  Problem Relation Age of Onset   Alcohol abuse Mother    Diabetes Mother    Bipolar disorder Mother    Cirrhosis Mother    Autism Brother    Drug abuse Maternal Grandfather    Drug abuse Maternal Grandmother    Depression Maternal Grandmother    Diabetes Maternal Grandmother    Hyperlipidemia Maternal Grandmother    COPD Paternal Grandmother    Diabetes Paternal Grandmother    Hyperlipidemia Paternal Grandmother    Heart failure Paternal Grandmother     Social History: I have reviewed social history  from my progress note on 01/07/2023. Social History   Socioeconomic History   Marital status: Single    Spouse name: Not on file   Number of children: 0   Years of education: 12   Highest education level: High school graduate  Occupational History   Occupation: shift lead    Comment: [employer]  Tobacco Use   Smoking status: Never   Smokeless tobacco: Never  Vaping Use   Vaping status: Every Day   Substances: Nicotine, Flavoring  Substance and Sexual Activity   Alcohol use: Never   Drug  use: Yes    Types: Marijuana    Comment: daily use   Sexual activity: Not Currently  Other Topics Concern   Not on file  Social History Narrative   Not on file   Social Determinants of Health   Financial Resource Strain: Low Risk  (11/02/2022)   Overall Financial Resource Strain (CARDIA)    Difficulty of Paying Living Expenses: Not hard at all  Food Insecurity: No Food Insecurity (11/02/2022)   Hunger Vital Sign    Worried About Running Out of Food in the Last Year: Never true    Ran Out of Food in the Last Year: Never true  Transportation Needs: No Transportation Needs (11/02/2022)   PRAPARE - Administrator, Civil Service (Medical): No    Lack of Transportation (Non-Medical): No  Physical Activity: Inactive (11/02/2022)   Exercise Vital Sign    Days of Exercise per Week: 0 days    Minutes of Exercise per Session: 0 min  Stress: Stress Concern Present (11/02/2022)   [employer] of Occupational Health - Occupational Stress Questionnaire    Feeling of Stress : Very much  Social Connections: Socially Isolated (11/02/2022)   Social Connection and Isolation Panel [NHANES]    Frequency of Communication with Friends and Family: More than three times a week    Frequency of Social Gatherings with Friends and Family: More than three times a week    Attends Religious Services: Never    Database administrator or Organizations: No    Attends Engineer, structural: Never    Marital Status: Separated    Allergies: No Known Allergies  Metabolic Disorder Labs: Lab Results  Component Value Date   HGBA1C 5.2 10/22/2022   MPG 103 10/22/2022   Lab Results  Component Value Date   PROLACTIN 12.3 10/22/2022   Lab Results  Component Value Date   CHOL 175 (H) 10/22/2022   TRIG 73 10/22/2022   HDL 62 10/22/2022   CHOLHDL 2.8 10/22/2022   LDLCALC 97 10/22/2022   Lab Results  Component Value Date   TSH 0.80 10/22/2022    Therapeutic Level Labs: No results found  for: "LITHIUM" No results found for: "VALPROATE" No results found for: "CBMZ"  Current Medications: Current Outpatient Medications  Medication Sig Dispense Refill   escitalopram (LEXAPRO) 20 MG tablet Take 1 tablet by mouth once daily 90 tablet 0   No current facility-administered medications for this visit.     Musculoskeletal: Strength & Muscle Tone: within normal limits Gait & Station: normal Patient leans: N/A  Psychiatric Specialty Exam: Review of Systems  Psychiatric/Behavioral:  Positive for decreased concentration, dysphoric mood and sleep disturbance.     Blood pressure 123/85, pulse 67, temperature 97.8 F (36.6 C), temperature source Skin, height 5' 3.5" (1.613 m), weight 199 lb 3.2 oz (90.4 kg).Body mass index is 34.73 kg/m.  General Appearance: Fairly Groomed  Eye Contact:  Fair  Speech:  Clear and Coherent  Volume:  Normal  Mood:  Depressed and Irritable  Affect:  Congruent  Thought Process:  Goal Directed and Descriptions of Associations: Intact  Orientation:  Full (Time, Place, and Person)  Thought Content: Logical   Suicidal Thoughts:  No  Homicidal Thoughts:  No  Memory:  Immediate;   Fair Recent;   Fair Remote;   Fair  Judgement:  Fair  Insight:  Fair  Psychomotor Activity:  Normal  Concentration:  Concentration: Fair and Attention Span: Fair  Recall:  [employer] of Knowledge: Fair  Language: Fair  Akathisia:  No  Handed:  Ambidextrous  AIMS (if indicated): not done  Assets:  Communication Skills Desire for Improvement Housing Social Support Talents/Skills Transportation  ADL's:  Intact  Cognition: WNL  Sleep:  Poor   Screenings: GAD-7    Flowsheet Row Office Visit from 02/17/2023 in [facility] Video Visit from 01/07/2023 in [facility] Office Visit from 12/14/2022 in [facility] Office Visit from 11/16/2022 in [facility] Office Visit from 11/02/2022 in [facility]  Total GAD-7 Score 10 9 6 10 13       PHQ2-9    Flowsheet Row Office Visit from 02/17/2023 in [facility] Video Visit from 01/07/2023 in [facility] Office Visit from 12/14/2022 in [facility] Office Visit from 11/16/2022 in [facility] Office Visit from 11/02/2022 in [facility]  PHQ-2 Total Score 4 4 4 4 4   PHQ-9 Total Score 11 16 13 12 16       Flowsheet Row Office Visit from 02/17/2023 in [facility] Video Visit from 01/07/2023 in [facility]  C-SSRS RISK CATEGORY No Risk Low Risk        Assessment and Plan: Tmia Guerrero is a 22 year old African-American female, single, employed, lives in [place], has a history of episodic mood disorder, comorbid use of 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  Plan Episodic mood disorder-unstable Continue Lexapro 20 mg p.o. daily Discontinue BuSpar for noncompliance and lack of benefit. Will consider starting Abilify or Rexulti-provided medication education.  Patient to get labs and EKG completed prior to that. Patient to establish care with therapist-CBT/DBT for mood swings/cluster B traits. Provided information for Ms. Felecia Jan.  Cannabis use currently in early remission Patient to get a urine drug screen completed today.  Patient to go to [facility] lab.  High risk medication use-ordered UDS I have reviewed and discussed labs-lipid panel-10/22/2022-cholesterol slightly elevated at 175, hemoglobin A1c-5.2,  TSH-0.80-within normal limits, prolactin level-12.3-within normal limits.  At risk for prolonged QT syndrome-will order EKG.  Patient to call [phone].  Follow-up in clinic in 3 to 4 weeks or sooner in person.   Collaboration of Care: Collaboration of Care: Referral or follow-up with counselor/therapist AEB patient encouraged to establish care with therapist.  Patient/Guardian was advised Release of Information must be obtained prior to any record release in order to collaborate their care with an outside provider. Patient/Guardian was advised if they have not already done so to contact the registration department to sign all necessary forms in order for [place] to release  information regarding their care.   Consent: Patient/Guardian gives verbal consent for treatment and assignment of benefits for services provided during this visit. Patient/Guardian expressed understanding and agreed to proceed.   This note was generated in part or whole with voice recognition software. Voice recognition is usually quite accurate but there are transcription errors that can and very often do occur. I apologize for any typographical errors that were not detected and corrected.    Jomarie Longs, MD 02/17/2023, 6:31 PM

## 2023-02-17 NOTE — Patient Instructions (Addendum)
[facility] [address] [place] [place], [place] [address]  [phone]   Please call for EKG - [phone]   Aripiprazole Tablets What is this medication? ARIPIPRAZOLE (ay ri PIP ray zole) treats schizophrenia, bipolar I disorder, autism spectrum disorder, and Tourette disorder. It may also be used with antidepressant medications to treat depression. It works by balancing the levels of dopamine and serotonin in the brain, hormones that help regulate mood, behaviors, and thoughts. It belongs to a group of medications called antipsychotics. Antipsychotics can be used to treat several kinds of mental health conditions. This medicine may be used for other purposes; ask your health care provider or pharmacist if you have questions. COMMON BRAND NAME(S): Abilify What should I tell my care team before I take this medication? They need to know if you have any of these conditions: Dementia Diabetes Difficulty swallowing Have trouble controlling your muscles Heart disease History of irregular heartbeat History of stroke Low blood cell levels (white cells, red cells, and platelets) Low blood pressure Parkinson disease Seizures Suicidal thoughts, plans, or attempt by you or a family member Urges to engage in impulsive behaviors in ways that are unusual for you An unusual or allergic reaction to aripiprazole, other medications, foods, dyes, or preservatives Pregnant or trying to get pregnant Breastfeeding How should I use this medication? Take this medication by mouth with a glass of water. Take it as directed on the prescription label at the same time every day. You can take it with or without food. If it upsets your stomach, take it with food. Do not take your medication more often than directed. Keep taking it unless your care team tells you to stop. A special MedGuide will be given to you by the pharmacist with each prescription and refill. Be sure to read this information  carefully each time. Talk to your care team about the use of this medication in children. While it may be prescribed for children as young as 6 years for selected conditions, precautions do apply. Overdosage: If you think you have taken too much of this medicine contact a poison control center or emergency room at once. NOTE: This medicine is only for you. Do not share this medicine with others. What if I miss a dose? If you miss a dose, take it as soon as you can. If it is almost time for your next dose, take only that dose. Do not take double or extra doses. What may interact with this medication? Do not take this medication with any of the following: Brexpiprazole Cisapride Dextromethorphan; quinidine Dronedarone Metoclopramide Pimozide Quinidine Thioridazine This medication may also interact with the following: Antihistamines for allergy, cough, and cold Carbamazepine Certain medications for anxiety or sleep Certain medications for depression, such as amitriptyline, fluoxetine, paroxetine, or sertraline Certain medications for fungal infections, such as fluconazole, itraconazole, ketoconazole, posaconazole, or voriconazole Clarithromycin General anesthetics, such as halothane, isoflurane, methoxyflurane, or propofol Medications for Parkinson disease, such as levodopa Medications for blood pressure Medications for seizures Medications that relax muscles for surgery Opioid medications for pain Other medications that cause heart rhythm changes Phenothiazines, such as chlorpromazine or prochlorperazine Rifampin This list may not describe all possible interactions. Give your health care provider a list of all the medicines, herbs, non-prescription drugs, or dietary supplements you use. Also tell them if you smoke, drink alcohol, or use illegal drugs. Some items may interact with your medicine. What should I watch for while using this medication? Visit your care team  for regular  checks on your progress. Tell your care team if your symptoms do not start to get better or if they get worse. Do not suddenly stop taking this medication. You may develop a severe reaction. Your care team will tell you how much medication to take. If your care team wants you to stop the medication, the dose may be slowly lowered over time to avoid any side effects. Patients and their families should watch out for new or worsening depression or thoughts of suicide. Also watch out for sudden changes in feelings such as feeling anxious, agitated, panicky, irritable, hostile, aggressive, impulsive, severely restless, overly excited and hyperactive, or not being able to sleep. If this happens, especially at the beginning of antidepressant treatment or after a change in dose, call your care team. This medication may affect your coordination, reaction time, or judgment. Do not drive or operate machinery until you know how this medication affects you. Sit up or stand slowly to reduce the risk of dizzy or fainting spells. Drinking alcohol with this medication can increase the risk of these side effects. This medication can cause problems with controlling your body temperature. It can lower the response of your body to cold temperatures. If possible, stay indoors during cold weather. If you must go outdoors, wear warm clothes. It can also lower the response of your body to heat. Do not overheat. Do not over-exercise. Stay out of the sun when possible. If you must be in the sun, wear cool clothing. Drink plenty of water. If you have trouble controlling your body temperature, call your care team right away. This medication may cause dry eyes and blurred vision. If you wear contact lenses, you may feel some discomfort. Lubricating eye drops may help. See your care team if the problem does not go away or is severe. This medication may increase blood sugar. Ask your care team if changes in diet or medications are needed if  you have diabetes. There have been reports of increased sexual urges or other strong urges such as gambling while taking this medication. If you experience any of these while taking this medication, you should report this to your care team as soon as possible. What side effects may I notice from receiving this medication? Side effects that you should report to your care team as soon as possible: Allergic reactions--skin rash, itching, hives, swelling of the face, lips, tongue, or throat High blood sugar (hyperglycemia)--increased thirst or amount of urine, unusual weakness or fatigue, blurry vision High fever, stiff muscles, increased sweating, fast or irregular heartbeat, and confusion, which may be signs of neuroleptic malignant syndrome Low blood pressure--dizziness, feeling faint or lightheaded, blurry vision Pain or trouble swallowing Prolonged or painful erection Seizures Stroke--sudden numbness or weakness of the face, arm, or leg, trouble speaking, confusion, trouble walking, loss of balance or coordination, dizziness, severe headache, change in vision Uncontrolled and repetitive body movements, muscle stiffness or spasms, tremors or shaking, loss of balance or coordination, restlessness, shuffling walk, which may be signs of extrapyramidal symptoms (EPS) Thoughts of suicide or self-harm, worsening mood, feelings of depression Urges to engage in impulsive behaviors such as gambling, binge eating, sexual activity, or shopping in ways that are unusual for you Side effects that usually do not require medical attention (report these to your care team if they continue or are bothersome): Constipation Drowsiness Weight gain This list may not describe all possible side effects. Call your doctor for medical advice about side effects. You may  report side effects to FDA at 1-800-FDA-1088. Where should I keep my medication? Keep out of the reach of children and pets. Store at room temperature  between 15 and 30 degrees C (59 and 86 degrees F). Throw away any unused medication after the expiration date. NOTE: This sheet is a summary. It may not cover all possible information. If you have questions about this medicine, talk to your doctor, pharmacist, or health care provider.  2024 Elsevier/Gold Standard (2022-01-09 00:00:00)  Brexpiprazole Tablets What is this medication? BREXPIPRAZOLE (brex PIP ray zole) treats schizophrenia. It may also be used with antidepressant medication to treat depression. It can also be used to treat agitation caused by Alzheimer disease. It works by balancing the levels of dopamine and serotonin in your brain, substances that help regulate mood, behaviors, and thoughts. It belongs to a group of medications called antipsychotics. Antipsychotic medications can be used to treat several kinds of mental health conditions. This medicine may be used for other purposes; ask your health care provider or pharmacist if you have questions. COMMON BRAND NAME(S): REXULTI What should I tell my care team before I take this medication? They need to know if you have any of these conditions: Dementia Diabetes Difficulty swallowing Have trouble controlling your muscles Have urges you are unable to control (for example, gambling, spending money, or eating) Heart disease High cholesterol History of breast cancer History of stroke Kidney disease Liver disease Low blood counts, like low white cell, platelet, or red cell counts Low blood pressure Parkinson's disease Seizures Suicidal thoughts, plans or attempt; a previous suicide attempt by you or a family member An unusual or allergic reaction to brexpiprazole, other medications, foods, dyes, or preservatives Pregnant or trying to get pregnant Breast-feeding How should I use this medication? Take this medication by mouth with water. Take it as directed on the prescription label at the same time every day. You can take it  with or without food. If it upsets your stomach, take it with food. Keep taking this medication unless your care team tells you to stop. Stopping it too quickly can cause serious side effects. It can also make your condition worse. A special MedGuide will be given to you by the pharmacist with each prescription and refill. Be sure to read this information carefully each time. Talk to your care team about the use of this medication in children. While it may be prescribed for children as young as 13 years for selected conditions, precautions do apply. Overdosage: If you think you have taken too much of this medicine contact a poison control center or emergency room at once. NOTE: This medicine is only for you. Do not share this medicine with others. What if I miss a dose? If you miss a dose, take it as soon as you can. If it is almost time for your next dose, take only that dose. Do not take double or extra doses. What may interact with this medication? Do not take this medication with any of the following: Aripiprazole Metoclopramide This medication may also interact with the following: Antihistamines for allergy, cough, and cold Certain medications for anxiety or sleep Certain medications for depression like amitriptyline, duloxetine, fluoxetine, paroxetine, sertraline Certain medications for fungal infections like fluconazole, itraconazole, ketoconazole Clarithromycin General anesthetics like halothane, isoflurane, methoxyflurane, propofol Levodopa or other medications for Parkinson's disease Medications for blood pressure Medications that relax muscles for surgery Medications for seizures Narcotic medications for pain Phenothiazines like chlorpromazine, prochlorperazine, thioridazine Quinidine Rifampin [facility]'s  Wort This list may not describe all possible interactions. Give your health care provider a list of all the medicines, herbs, non-prescription drugs, or dietary supplements you  use. Also tell them if you smoke, drink alcohol, or use illegal drugs. Some items may interact with your medicine. What should I watch for while using this medication? Visit your care team for regular checks on your progress. Tell your care team if symptoms do not start to get better or if they get worse. Do not stop taking except on your care team's advice. You may develop a severe reaction. Your care team will tell you how much medication to take. Patients and their families should watch out for new or worsening depression or thoughts of suicide. Also watch out for sudden changes in feelings such as feeling anxious, agitated, panicky, irritable, hostile, aggressive, impulsive, severely restless, overly excited and hyperactive, or not being able to sleep. If this happens, especially at the beginning of antidepressant treatment or after a change in dose, call your care team. You may get dizzy or drowsy. Do not drive, use machinery, or do anything that needs mental alertness until you know how this medication affects you. Do not stand or sit up quickly, especially if you are an older patient. This reduces the risk of dizzy or fainting spells. Alcohol may interfere with the effect of this medication. Avoid alcoholic drinks. There have been reports of increased sexual urges or other strong urges such as gambling while taking this medication. If you experience any of these while taking this medication, you should report this to your care team as soon as possible. This medication may cause dry eyes and blurred vision. If you wear contact lenses you may feel some discomfort. Lubricating drops may help. See your eye care team if the problem does not go away or is severe. This medication may increase blood sugar. Ask your care team if changes in diet or medications are needed if you have diabetes. This medication can cause problems with controlling your body temperature. It can lower the response of your body to  cold temperatures. If possible, stay indoors during cold weather. If you must go outdoors, wear warm clothes. It can also lower the response of your body to heat. Do not overheat. Do not over-exercise. Stay out of the sun when possible. If you must be in the sun, wear cool clothing. Drink plenty of water. If you have trouble controlling your body temperature, call your care team right away. What side effects may I notice from receiving this medication? Side effects that you should report to your care team as soon as possible: Allergic reactions--skin rash, itching, hives, swelling of the face, lips, tongue, or throat High blood sugar (hyperglycemia)--increased thirst or amount of urine, unusual weakness or fatigue, blurry vision High fever, stiff muscles, increased sweating, fast or irregular heartbeat, and confusion, which may be signs of neuroleptic malignant syndrome Infection--fever, chills, cough, sore throat Low blood pressure--dizziness, feeling faint or lightheaded, blurry vision Pain or trouble swallowing Seizures Stroke--sudden numbness or weakness of the face, arm, or leg, trouble speaking, confusion, trouble walking, loss of balance or coordination, dizziness, severe headache, change in vision Thoughts of suicide or self-harm, worsening mood, feelings of depression Uncontrolled and repetitive body movements, muscle stiffness or spasms, tremors or shaking, loss of balance or coordination, restlessness, shuffling walk, which may be signs of extrapyramidal symptoms (EPS) Urges to engage in impulsive behaviors such as gambling, binge eating, sexual activity, or shopping in ways  that are unusual for you Side effects that usually do not require medical attention (report to your care team if they continue or are bothersome): Constipation Drowsiness Headache Weight gain This list may not describe all possible side effects. Call your doctor for medical advice about side effects. You may report  side effects to FDA at 1-800-FDA-1088. Where should I keep my medication? Keep out of the reach of children and pets. Store at room temperature between 20 and 25 degrees C (68 and 77 degrees F). Get rid of any unused medication after the expiration date. To get rid of medications that are no longer needed or have expired: Take the medication to a medication take-back program. Check with your pharmacy or law enforcement to find a location. If you cannot return the medication, check the label or package insert to see if the medication should be thrown out in the garbage or flushed down the toilet. If you are not sure, ask your care team. If it is safe to put it in the trash, take the medication out of the container. Mix the medication with cat litter, dirt, coffee grounds, or other unwanted substance. Seal the mixture in a bag or container. Put it in the trash. NOTE: This sheet is a summary. It may not cover all possible information. If you have questions about this medicine, talk to your doctor, pharmacist, or health care provider.  2024 Elsevier/Gold Standard (2021-11-23 00:00:00)

## 2023-02-22 ENCOUNTER — Ambulatory Visit
Admission: RE | Admit: 2023-02-22 | Discharge: 2023-02-22 | Disposition: A | Discharge status: Home / Self Care | Payer: 59 | Source: Ambulatory Visit | Attending: Psychiatry | Admitting: Psychiatry

## 2023-02-22 ENCOUNTER — Other Ambulatory Visit
Admission: RE | Admit: 2023-02-22 | Discharge: 2023-02-22 | Disposition: A | Discharge status: Home / Self Care | Payer: 59 | Source: Ambulatory Visit | Attending: Psychiatry | Admitting: Psychiatry

## 2023-02-22 DIAGNOSIS — Z79899 Other long term (current) drug therapy: Secondary | ICD-10-CM | POA: Diagnosis not present

## 2023-02-22 DIAGNOSIS — Z9189 Other specified personal risk factors, not elsewhere classified: Secondary | ICD-10-CM | POA: Insufficient documentation

## 2023-02-22 DIAGNOSIS — F39 Unspecified mood [affective] disorder: Secondary | ICD-10-CM | POA: Insufficient documentation

## 2023-02-22 DIAGNOSIS — F1290 Cannabis use, unspecified, uncomplicated: Secondary | ICD-10-CM | POA: Diagnosis not present

## 2023-02-22 DIAGNOSIS — I498 Other specified cardiac arrhythmias: Secondary | ICD-10-CM | POA: Diagnosis not present

## 2023-02-23 ENCOUNTER — Ambulatory Visit: Payer: Self-pay | Admitting: Psychiatry

## 2023-02-24 ENCOUNTER — Telehealth: Payer: Self-pay | Admitting: Psychiatry

## 2023-02-24 DIAGNOSIS — F39 Unspecified mood [affective] disorder: Secondary | ICD-10-CM

## 2023-02-24 LAB — URINE DRUGS OF ABUSE SCREEN W ALC, ROUTINE (REF LAB)
Amphetamines, Urine: NEGATIVE ng/mL
Barbiturate, Ur: NEGATIVE ng/mL
Benzodiazepine Quant, Ur: NEGATIVE ng/mL
Cocaine (Metab.): NEGATIVE ng/mL
Ethanol U, Quan: NEGATIVE %
Methadone Screen, Urine: NEGATIVE ng/mL
Opiate Quant, Ur: NEGATIVE ng/mL
Phencyclidine, Ur: NEGATIVE ng/mL
Propoxyphene, Urine: NEGATIVE ng/mL

## 2023-02-24 LAB — PANEL 799049
CARBOXY THC GC/MS CONF: >750 ng/mL
Cannabinoid GC/MS, Ur: POSITIVE — AB

## 2023-02-24 MED ORDER — ARIPIPRAZOLE 2 MG PO TABS: 2.0000 mg | ORAL_TABLET | Freq: Every morning | ORAL | 0 refills | Status: DC

## 2023-02-24 NOTE — Telephone Encounter (Signed)
Contacted patient to discuss EKG, will start Abilify low dosage 2 mg.  Patient provided education.

## 2023-03-16 ENCOUNTER — Encounter: Payer: Self-pay | Admitting: Nurse Practitioner

## 2023-03-29 ENCOUNTER — Ambulatory Visit: Payer: 59 | Admitting: Psychiatry

## 2023-04-05 ENCOUNTER — Other Ambulatory Visit: Payer: Self-pay | Admitting: Psychiatry

## 2023-04-05 DIAGNOSIS — F39 Unspecified mood [affective] disorder: Secondary | ICD-10-CM

## 2023-04-05 MED ORDER — ARIPIPRAZOLE 2 MG PO TABS: 2.0000 mg | ORAL_TABLET | Freq: Every morning | ORAL | 0 refills | Status: DC

## 2023-04-05 NOTE — Telephone Encounter (Signed)
I have sent Abilify to pharmacy. 

## 2023-04-22 ENCOUNTER — Telehealth (INDEPENDENT_AMBULATORY_CARE_PROVIDER_SITE_OTHER): Payer: 59 | Admitting: Psychiatry

## 2023-04-22 ENCOUNTER — Encounter: Payer: Self-pay | Admitting: Psychiatry

## 2023-04-22 DIAGNOSIS — Z9189 Other specified personal risk factors, not elsewhere classified: Secondary | ICD-10-CM

## 2023-04-22 DIAGNOSIS — Z79899 Other long term (current) drug therapy: Secondary | ICD-10-CM

## 2023-04-22 DIAGNOSIS — F1290 Cannabis use, unspecified, uncomplicated: Secondary | ICD-10-CM | POA: Diagnosis not present

## 2023-04-22 DIAGNOSIS — Z91148 Patient's other noncompliance with medication regimen for other reason: Secondary | ICD-10-CM | POA: Insufficient documentation

## 2023-04-22 DIAGNOSIS — F39 Unspecified mood [affective] disorder: Secondary | ICD-10-CM

## 2023-04-22 MED ORDER — ESCITALOPRAM OXALATE 20 MG PO TABS: 20.0000 mg | ORAL_TABLET | Freq: Every day | ORAL | 1 refills | Status: DC

## 2023-04-22 MED ORDER — ARIPIPRAZOLE 5 MG PO TABS: 5.0000 mg | ORAL_TABLET | Freq: Every day | ORAL | 1 refills | Status: DC

## 2023-04-22 NOTE — Progress Notes (Signed)
Virtual Visit via Video Note  I connected with Jessica Guerrero on 04/22/23 at 10:30 AM EDT by a video enabled telemedicine application and verified that I am speaking with the correct person using two identifiers.  Location Provider Location : [facility] Patient Location : Home  Participants: Patient , Provider    I discussed the limitations of evaluation and management by telemedicine and the availability of in person appointments. The patient expressed understanding and agreed to proceed.   I discussed the assessment and treatment plan with the patient. The patient was provided an opportunity to ask questions and all were answered. The patient agreed with the plan and demonstrated an understanding of the instructions.   The patient was advised to call back or seek an in-person evaluation if the symptoms worsen or if the condition fails to improve as anticipated.    BH MD OP Progress Note  04/22/2023 12:10 PM Jessica Guerrero  MRN:  [id number]  Chief Complaint:  Chief Complaint  Patient presents with   Follow-up   Depression   Anxiety   Medication Refill   HPI: Jessica Guerrero is a 22 year old African-American female, employed, single, lives in [place], has a history of episodic mood disorder, hidradenitis suppurativa, was evaluated by telemedicine today.  Patient today reports she has noticed some improvement in her mood when she takes the Abilify however she has not been compliant.  She reports she gets so busy at work and sometimes with work shift changes she forgets to take it in the morning with her Lexapro.  She reports by the time she remembers to take it it is already too late and she has been worried about taking it too late due to possibility of it affecting her sleep.  Patient however reports she currently has multiple situational stressors.  She reports she is currently having a 'crush' on someone.  That is triggering a lot of of internal anxiety and  irritability.  She hence gets angry on and off.  She is interested in increasing the dosage of Abilify and would like to start taking it more regularly.  She currently denies side effects.  Patient reports work is going well.  Patient continues to be in psychotherapy sessions with Ms. Felecia Jan and reports therapy sessions are helpful.  Patient denies any suicidality, homicidality.  Patient with history of having visual hallucinations of seeing shadows.  Patient did not express this at this session.  Patient denies any appetite changes.  Continues to use cannabis, agreeable to cut back.  Denies any other concerns today.  Visit Diagnosis:    ICD-10-CM   1. Episodic mood disorder (HCC)  F39 escitalopram (LEXAPRO) 20 MG tablet    ARIPiprazole (ABILIFY) 5 MG tablet    2. Cannabis use, unspecified, uncomplicated  F12.90     3. High risk medication use  Z79.899     4. At risk for prolonged QT interval syndrome  Z91.89     5. Noncompliance with medication regimen  Z91.148       Past Psychiatric History: I have reviewed past psychiatric history from progress note on 01/07/2023.  Past trials of Lexapro, BuSpar.  Patient does report a history of possible overdose which likely could have been a combination of a 'cry for attention as well as suicidality 'when she was in middle school.  Patient however calls it ' not a wise decision' since she just took a few ibuprofen and thought it would do something to her but nothing happened.  Past Medical History:  Past Medical History:  Diagnosis Date   Depression    Hydradenitis    History reviewed. No pertinent surgical history.  Family Psychiatric History: I have reviewed family psychiatric history from progress note on 01/07/2023.  Family History:  Family History  Problem Relation Age of Onset   Alcohol abuse Mother    Diabetes Mother    Bipolar disorder Mother    Cirrhosis Mother    Autism Brother    Drug abuse Maternal Grandfather     Drug abuse Maternal Grandmother    Depression Maternal Grandmother    Diabetes Maternal Grandmother    Hyperlipidemia Maternal Grandmother    COPD Paternal Grandmother    Diabetes Paternal Grandmother    Hyperlipidemia Paternal Grandmother    Heart failure Paternal Grandmother     Social History: I have reviewed social history from progress note on 01/07/2023. Social History   Socioeconomic History   Marital status: Single    Spouse name: Not on file   Number of children: 0   Years of education: 12   Highest education level: High school graduate  Occupational History   Occupation: shift lead    Comment: [employer]  Tobacco Use   Smoking status: Never   Smokeless tobacco: Never  Vaping Use   Vaping status: Every Day   Substances: Nicotine, Flavoring  Substance and Sexual Activity   Alcohol use: Never   Drug use: Yes    Types: Marijuana    Comment: daily use   Sexual activity: Not Currently  Other Topics Concern   Not on file  Social History Narrative   Not on file   Social Determinants of Health   Financial Resource Strain: Low Risk  (11/02/2022)   Overall Financial Resource Strain (CARDIA)    Difficulty of Paying Living Expenses: Not hard at all  Food Insecurity: No Food Insecurity (11/02/2022)   Hunger Vital Sign    Worried About Running Out of Food in the Last Year: Never true    Ran Out of Food in the Last Year: Never true  Transportation Needs: No Transportation Needs (11/02/2022)   PRAPARE - Administrator, Civil Service (Medical): No    Lack of Transportation (Non-Medical): No  Physical Activity: Inactive (11/02/2022)   Exercise Vital Sign    Days of Exercise per Week: 0 days    Minutes of Exercise per Session: 0 min  Stress: Stress Concern Present (11/02/2022)   [employer] of Occupational Health - Occupational Stress Questionnaire    Feeling of Stress : Very much  Social Connections: Socially Isolated (11/02/2022)   Social Connection and  Isolation Panel [NHANES]    Frequency of Communication with Friends and Family: More than three times a week    Frequency of Social Gatherings with Friends and Family: More than three times a week    Attends Religious Services: Never    Database administrator or Organizations: No    Attends Banker Meetings: Never    Marital Status: Separated    Allergies: No Known Allergies  Metabolic Disorder Labs: Lab Results  Component Value Date   HGBA1C 5.2 10/22/2022   MPG 103 10/22/2022   Lab Results  Component Value Date   PROLACTIN 12.3 10/22/2022   Lab Results  Component Value Date   CHOL 175 (H) 10/22/2022   TRIG 73 10/22/2022   HDL 62 10/22/2022   CHOLHDL 2.8 10/22/2022   LDLCALC 97 10/22/2022   Lab Results  Component Value Date  TSH 0.80 10/22/2022    Therapeutic Level Labs: No results found for: "LITHIUM" No results found for: "VALPROATE" No results found for: "CBMZ"  Current Medications: Current Outpatient Medications  Medication Sig Dispense Refill   ARIPiprazole (ABILIFY) 5 MG tablet Take 1 tablet (5 mg total) by mouth daily. 30 tablet 1   escitalopram (LEXAPRO) 20 MG tablet Take 1 tablet (20 mg total) by mouth daily. 90 tablet 1   No current facility-administered medications for this visit.     Musculoskeletal: Strength & Muscle Tone:  UTA Gait & Station:  Seated Patient leans: N/A  Psychiatric Specialty Exam: Review of Systems  Psychiatric/Behavioral:  Positive for sleep disturbance.        Irritable, mood swings    There were no vitals taken for this visit.There is no height or weight on file to calculate BMI.  General Appearance: Fairly Groomed  Eye Contact:  Fair  Speech:  Clear and Coherent  Volume:  Normal  Mood:  Irritable and mood swings  Affect:  Appropriate  Thought Process:  Goal Directed and Descriptions of Associations: Intact  Orientation:  Full (Time, Place, and Person)  Thought Content: Logical   Suicidal Thoughts:   No  Homicidal Thoughts:  No  Memory:  Immediate;   Fair Recent;   Fair Remote;   Fair  Judgement:  Fair  Insight:  Fair  Psychomotor Activity:  Normal  Concentration:  Concentration: Fair and Attention Span: Fair  Recall:  [employer] of Knowledge: Fair  Language: Fair  Akathisia:  No  Handed:  Right  AIMS (if indicated): not done  Assets:  Communication Skills Desire for Improvement Housing Social Support  ADL's:  Intact  Cognition: WNL  Sleep:   Restless at times mostly due to schedule changes at work   Screenings: GAD-7    Loss adjuster, chartered Office Visit from 02/17/2023 in [facility] Video Visit from 01/07/2023 in [facility] Office Visit from 12/14/2022 in [facility] Office Visit from 11/16/2022 in [facility] Visit from 11/02/2022 in [facility]  Total GAD-7 Score 10 9 6 10 13       PHQ2-9    Flowsheet Row Video Visit from 04/22/2023 in [facility] Office Visit from 02/17/2023 in [facility] Video Visit from 01/07/2023 in [facility] Office Visit from 12/14/2022 in [facility] Office Visit from 11/16/2022 in [facility]  PHQ-2 Total Score 2 4 4 4 4   PHQ-9 Total Score 7 11 16 13 12       Flowsheet Row Video Visit from 04/22/2023 in [facility] Office Visit from 02/17/2023 in [facility] Video Visit from 01/07/2023 in [facility]  C-SSRS RISK CATEGORY No Risk No Risk Low Risk        Assessment and Plan: Jessica Guerrero is a 23 year old African-American female, single, employed, lives in [place], has a history of  episodic mood disorder, comorbid use of cannabis, patient continues to have multiple situational stressors, continued mood symptoms , comorbid use of cannabis noncompliance with medication regimen, agreeable to start being compliant on medications as prescribed as well as agreeable to dosage increase of Abilify to target her mood symptoms.  Discussed plan as noted below.  Plan Episodic mood disorder-unstable Lexapro 20 mg p.o.  daily Increase Abilify to 5 mg p.o. daily. 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 Felecia Jan.  Encouraged compliance with therapy.  Cannabis use disorder-mild-unstable Provided counseling.  Noncompliance with medication regimen-patient provided counseling.  Encouraged compliance.  Patient advised to set up a pillbox and set reminders.  Patient also advised to take the Abilify even if it is a few hours late and to observe her sleep to see if it will really affect her sleep if she does take it late.  Will continue to reassess in future sessions.  High risk medication use-reviewed and discussed urine drug screen 02/22/2023-positive for cannabinoids.  Counseling provided.  At risk for prolonged QT syndrome-EKG reviewed-discussed with patient-02/22/2023-sinus rhythm with sinus arrhythmia-[id number]-393.   Collaboration of Care: Collaboration of Care: Referral or follow-up with counselor/therapist AEB patient encouraged to continue psychotherapy.  Patient/Guardian was advised Release of Information must be obtained prior to any record release in order to collaborate their care with an outside provider. Patient/Guardian was advised if they have not already done so to contact the registration department to sign all necessary forms in order for [place] to release information regarding their care.   Consent: Patient/Guardian gives verbal consent for treatment and assignment of benefits for  services provided during this visit. Patient/Guardian expressed understanding and agreed to proceed.   Follow-up in clinic in 4 weeks or sooner if needed.  This note was generated in part or whole with voice recognition software. Voice recognition is usually quite accurate but there are transcription errors that can and very often do occur. I apologize for any typographical errors that were not detected and corrected.    Jomarie Longs, MD 04/22/2023, 12:10 PM

## 2023-05-20 ENCOUNTER — Telehealth (INDEPENDENT_AMBULATORY_CARE_PROVIDER_SITE_OTHER): Payer: Self-pay | Admitting: Psychiatry

## 2023-05-20 DIAGNOSIS — F39 Unspecified mood [affective] disorder: Secondary | ICD-10-CM

## 2023-05-20 NOTE — Progress Notes (Signed)
No response to call or text or video invite  

## 2023-05-30 ENCOUNTER — Encounter: Payer: Self-pay | Admitting: Psychiatry

## 2023-05-30 ENCOUNTER — Telehealth: Payer: 59 | Admitting: Psychiatry

## 2023-05-30 DIAGNOSIS — F39 Unspecified mood [affective] disorder: Secondary | ICD-10-CM

## 2023-05-30 DIAGNOSIS — F1290 Cannabis use, unspecified, uncomplicated: Secondary | ICD-10-CM | POA: Diagnosis not present

## 2023-05-30 NOTE — Progress Notes (Signed)
Virtual Visit via Video Note  I connected with Jessica Guerrero on 05/30/23 at  8:30 AM EST by a video enabled telemedicine application and verified that I am speaking with the correct person using two identifiers.  Location Provider Location : [facility] Patient Location : Home  Participants: Patient , Provider    I discussed the limitations of evaluation and management by telemedicine and the availability of in person appointments. The patient expressed understanding and agreed to proceed    I discussed the assessment and treatment plan with the patient. The patient was provided an opportunity to ask questions and all were answered. The patient agreed with the plan and demonstrated an understanding of the instructions.   The patient was advised to call back or seek an in-person evaluation if the symptoms worsen or if the condition fails to improve as anticipated.    BH MD OP Progress Note  05/30/2023 9:26 AM Jessica Guerrero  MRN:  [id number]  Chief Complaint:  Chief Complaint  Patient presents with   Follow-up   Anxiety   Depression   Medication Refill   HPI: Jessica Guerrero is a 22 year old African-American female, employed, single, lives in [place], has a history of episodic mood disorder, hidradenitis suppurativa, episodic cannabis use was evaluated by telemedicine today.  Patient is currently calm, cooperative.  Reports she is compliant on the Abilify and the Lexapro.  She has not had any side effects to the higher dosage of Abilify.  Patient reports she continues to struggle with intense emotions when it comes to her relationships.  Patient used the term ' idealization and devaluation '.  Patient with cluster B traits is currently working with Ms. Felecia Jan, therapist.  They are currently working on DBT skills.  Patient is motivated to stay in therapy.  She reports although the current combination of medications have made a difference she is not sure  if it is the medication or the combination of medication and therapy.  Patient reports sleep is affected on and off especially due to her work schedule.  The days that she is off she is able to sleep for 12 hours if she wants to.  Patient denies any suicidality, homicidality or perceptual disturbances.  Patient reports she is currently trying to cut back on cannabis use.  She has not needed it much.  Continues to be motivated to work on it.  Patient denies any other concerns today.  Visit Diagnosis:    ICD-10-CM   1. Episodic mood disorder (HCC)  F39     2. Cannabis use, unspecified, uncomplicated  F12.90       Past Psychiatric History: I have reviewed past psychiatric history from progress note on 01/07/2023.  Past trials of Lexapro, BuSpar.  Patient does report a history of possible overdose when she was in middle school when she took a couple of ibuprofen.  Patient with history of self-injurious behaviors.  Past Medical History:  Past Medical History:  Diagnosis Date   Depression    Hydradenitis    History reviewed. No pertinent surgical history.  Family Psychiatric History: I have reviewed family psychiatric history from progress note on 01/07/2023.  Family History:  Family History  Problem Relation Age of Onset   Alcohol abuse Mother    Diabetes Mother    Bipolar disorder Mother    Cirrhosis Mother    Autism Brother    Drug abuse Maternal Grandfather    Drug abuse Maternal Grandmother    Depression Maternal Grandmother  Diabetes Maternal Grandmother    Hyperlipidemia Maternal Grandmother    COPD Paternal Grandmother    Diabetes Paternal Grandmother    Hyperlipidemia Paternal Grandmother    Heart failure Paternal Grandmother     Social History: I have reviewed social history from progress note on 01/07/2023. Social History   Socioeconomic History   Marital status: Single    Spouse name: Not on file   Number of children: 0   Years of education: 12   Highest  education level: High school graduate  Occupational History   Occupation: shift lead    Comment: [employer]  Tobacco Use   Smoking status: Never   Smokeless tobacco: Never  Vaping Use   Vaping status: Every Day   Substances: Nicotine, Flavoring  Substance and Sexual Activity   Alcohol use: Never   Drug use: Yes    Types: Marijuana    Comment: daily use   Sexual activity: Not Currently  Other Topics Concern   Not on file  Social History Narrative   Not on file   Social Determinants of Health   Financial Resource Strain: Low Risk  (11/02/2022)   Overall Financial Resource Strain (CARDIA)    Difficulty of Paying Living Expenses: Not hard at all  Food Insecurity: No Food Insecurity (11/02/2022)   Hunger Vital Sign    Worried About Running Out of Food in the Last Year: Never true    Ran Out of Food in the Last Year: Never true  Transportation Needs: No Transportation Needs (11/02/2022)   PRAPARE - Administrator, Civil Service (Medical): No    Lack of Transportation (Non-Medical): No  Physical Activity: Inactive (11/02/2022)   Exercise Vital Sign    Days of Exercise per Week: 0 days    Minutes of Exercise per Session: 0 min  Stress: Stress Concern Present (11/02/2022)   [employer] of Occupational Health - Occupational Stress Questionnaire    Feeling of Stress : Very much  Social Connections: Socially Isolated (11/02/2022)   Social Connection and Isolation Panel [NHANES]    Frequency of Communication with Friends and Family: More than three times a week    Frequency of Social Gatherings with Friends and Family: More than three times a week    Attends Religious Services: Never    Database administrator or Organizations: No    Attends Engineer, structural: Never    Marital Status: Separated    Allergies: No Known Allergies  Metabolic Disorder Labs: Lab Results  Component Value Date   HGBA1C 5.2 10/22/2022   MPG 103 10/22/2022   Lab Results   Component Value Date   PROLACTIN 12.3 10/22/2022   Lab Results  Component Value Date   CHOL 175 (H) 10/22/2022   TRIG 73 10/22/2022   HDL 62 10/22/2022   CHOLHDL 2.8 10/22/2022   LDLCALC 97 10/22/2022   Lab Results  Component Value Date   TSH 0.80 10/22/2022    Therapeutic Level Labs: No results found for: "LITHIUM" No results found for: "VALPROATE" No results found for: "CBMZ"  Current Medications: Current Outpatient Medications  Medication Sig Dispense Refill   ARIPiprazole (ABILIFY) 5 MG tablet Take 1 tablet (5 mg total) by mouth daily. 30 tablet 1   escitalopram (LEXAPRO) 20 MG tablet Take 1 tablet (20 mg total) by mouth daily. 90 tablet 1   No current facility-administered medications for this visit.     Musculoskeletal: Strength & Muscle Tone:  UTA Gait & Station:  Seated  Patient leans: N/A  Psychiatric Specialty Exam: Review of Systems  Psychiatric/Behavioral:  Positive for sleep disturbance.        Mood swings    There were no vitals taken for this visit.There is no height or weight on file to calculate BMI.  General Appearance: Casual  Eye Contact:  Good  Speech:  Clear and Coherent  Volume:  Normal  Mood:   mood swings - improving  Affect:  Congruent  Thought Process:  Goal Directed and Descriptions of Associations: Intact  Orientation:  Full (Time, Place, and Person)  Thought Content: Logical   Suicidal Thoughts:  No  Homicidal Thoughts:  No  Memory:  Immediate;   Fair Recent;   Fair Remote;   Fair  Judgement:  Fair  Insight:  Fair  Psychomotor Activity:  Normal  Concentration:  Concentration: Fair and Attention Span: Fair  Recall:  [employer] of Knowledge: Fair  Language: Fair  Akathisia:  No  Handed:  Right  AIMS (if indicated): not done  Assets:  Desire for Improvement Housing Social Support Transportation  ADL's:  Intact  Cognition: WNL  Sleep:   varies due to work schedule   Screenings: GAD-7    Loss adjuster, chartered Office Visit  from 02/17/2023 in [facility] Video Visit from 01/07/2023 in [facility] Office Visit from 12/14/2022 in [facility] Office Visit from 11/16/2022 in [facility] Office Visit from 11/02/2022 in [facility]  Total GAD-7 Score 10 9 6 10 13       PHQ2-9    Flowsheet Row Video Visit from 05/30/2023 in [facility] Video Visit from 04/22/2023 in [facility] Office Visit from 02/17/2023 in [facility] Video Visit from 01/07/2023 in [facility] Office Visit from 12/14/2022 in [facility]  PHQ-2 Total Score 2 2 4 4 4   PHQ-9 Total Score 5 7 11 16 13       Flowsheet Row Video Visit from 05/30/2023 in [facility] Video Visit from 04/22/2023 in [facility] Office Visit from 02/17/2023 in [facility]  C-SSRS RISK CATEGORY No Risk No Risk No Risk        Assessment and Plan: Jessica Guerrero is a 22 year old African-American female, single, employed, lives in [place], has a history of episodic mood disorder, comorbid use of cannabis, was evaluated by telemedicine today.  Patient is currently in psychotherapy with good improvement on the combination of medication management and psychotherapy as well as has been cutting back on the use of cannabis, will continue to benefit from the same.  Plan Episodic mood disorder-improving Lexapro 20 mg p.o. daily Abilify 5 mg p.o. daily Continue CBT/DBT with Ms. Felecia Jan  Cannabis use disorder mild-improving Provided counseling patient is motivated to work on it.     Collaboration of Care:  Collaboration of Care: Referral or follow-up with counselor/therapist AEB patient encouraged to continue CBT/DBT  Patient/Guardian was advised Release of Information must be obtained prior to any record release in order to collaborate their care with an outside provider. Patient/Guardian was advised if they have not already done so to contact the registration department to sign all necessary forms in order for [place] to release information regarding their care.   Consent: Patient/Guardian gives verbal consent for treatment  and assignment of benefits for services provided during this visit. Patient/Guardian expressed understanding and agreed to proceed.  Follow-up in clinic in 2 months or sooner in person.  This note was generated in part or whole with voice recognition software. Voice recognition is usually quite accurate but there are transcription errors that can and very often do occur. I apologize for any typographical errors that were not detected and corrected.     Jomarie Longs, MD 05/30/2023, 9:26 AM

## 2023-08-03 ENCOUNTER — Encounter: Payer: Self-pay | Admitting: Psychiatry

## 2023-08-03 ENCOUNTER — Ambulatory Visit (INDEPENDENT_AMBULATORY_CARE_PROVIDER_SITE_OTHER): Payer: 59 | Admitting: Psychiatry

## 2023-08-03 VITALS — BP 122/86 | HR 97 | Temp 98.7°F | Ht 63.5 in | Wt 196.6 lb

## 2023-08-03 DIAGNOSIS — F1290 Cannabis use, unspecified, uncomplicated: Secondary | ICD-10-CM | POA: Diagnosis not present

## 2023-08-03 DIAGNOSIS — F411 Generalized anxiety disorder: Secondary | ICD-10-CM | POA: Insufficient documentation

## 2023-08-03 DIAGNOSIS — F3281 Premenstrual dysphoric disorder: Secondary | ICD-10-CM | POA: Diagnosis not present

## 2023-08-03 MED ORDER — SERTRALINE HCL 25 MG PO TABS: 25.0000 mg | ORAL_TABLET | Freq: Every day | ORAL | 1 refills | Status: DC

## 2023-08-03 NOTE — Patient Instructions (Signed)
Sertraline Tablets What is this medication? SERTRALINE (SER tra leen) treats depression, anxiety, obsessive-compulsive disorder (OCD), post-traumatic stress disorder (PTSD), and premenstrual dysphoric disorder (PMDD). It increases the amount of serotonin in the brain, a hormone that helps regulate mood. It belongs to a group of medications called SSRIs. This medicine may be used for other purposes; ask your health care provider or pharmacist if you have questions. COMMON BRAND NAME(S): Zoloft What should I tell my care team before I take this medication? They need to know if you have any of these conditions: Bleeding disorders Bipolar disorder or a family history of bipolar disorder Frequently drink alcohol Glaucoma Heart disease High blood pressure History of irregular heartbeat History of low levels of calcium, magnesium, or potassium in the blood Liver disease Receiving electroconvulsive therapy Seizures Suicidal thoughts, plans, or attempt by you or a family member Take medications that prevent or treat blood clots Thyroid disease An unusual or allergic reaction to sertraline, other medications, foods, dyes, or preservatives Pregnant or trying to get pregnant Breastfeeding How should I use this medication? Take this medication by mouth with a glass of water. Take it as directed on the prescription label at the same time every day. You can take it with or without food. If it upsets your stomach, take it with food. Do not take your medication more often than directed. Keep taking this medication unless your care team tells you to stop. Stopping it too quickly can cause serious side effects. It can also make your condition worse. A special MedGuide will be given to you by the pharmacist with each prescription and refill. Be sure to read this information carefully each time. Talk to your care team about the use of this medication in children. While it may be prescribed for children as  young as 7 years for selected conditions, precautions do apply. Overdosage: If you think you have taken too much of this medicine contact a poison control center or emergency room at once. NOTE: This medicine is only for you. Do not share this medicine with others. What if I miss a dose? If you miss a dose, take it as soon as you can. If it is almost time for your next dose, take only that dose. Do not take double or extra doses. What may interact with this medication? Do not take this medication with any of the following: Cisapride Dronedarone Linezolid MAOIs, such as Carbex, Eldepryl, Marplan, Nardil, and Parnate Methylene blue (injected into a vein) Pimozide Thioridazine This medication may also interact with the following: Alcohol Amphetamines Aspirin and aspirin-like medications Certain medications for fungal infections, such as ketoconazole, fluconazole, posaconazole, itraconazole Certain medications for irregular heart beat, such as flecainide, quinidine, propafenone Certain medications for mental health conditions Certain medications for migraine headaches, such as almotriptan, eletriptan, frovatriptan, naratriptan, rizatriptan, sumatriptan, zolmitriptan Certain medications for seizures, such as carbamazepine, valproic acid, phenytoin Certain medications for sleep Certain medications that prevent or treat blood clots, such as warfarin, enoxaparin, dalteparin Cimetidine Digoxin Diuretics Fentanyl Isoniazid Lithium NSAIDs, medications for pain and inflammation, such as ibuprofen or naproxen Other medications that cause heart rhythm changes, such as dofetilide Rasagiline Safinamide Supplements, such as St. John's wort, kava kava, valerian Tolbutamide Tramadol Tryptophan This list may not describe all possible interactions. Give your health care provider a list of all the medicines, herbs, non-prescription drugs, or dietary supplements you use. Also tell them if you smoke,  drink alcohol, or use illegal drugs. Some items may interact with your medicine.  What should I watch for while using this medication? Tell your care team if your symptoms do not get better or if they get worse. Visit your care team for regular checks on your progress. Because it may take several weeks to see the full effects of this medication, it is important to continue your treatment as prescribed by your care team. Patients and their families should watch out for new or worsening thoughts of suicide or depression. Also watch out for sudden changes in feelings such as feeling anxious, agitated, panicky, irritable, hostile, aggressive, impulsive, severely restless, overly excited and hyperactive, or not being able to sleep. If this happens, especially at the beginning of treatment or after a change in dose, call your care team. This medication may affect your coordination, reaction time, or judgment. Do not drive or operate machinery until you know how this medication affects you. Sit or stand up slowly to reduce the risk of dizzy or fainting spells. Drinking alcohol with this medication can increase the risk of these side effects. Your mouth may get dry. Chewing sugarless gum or sucking hard candy, and drinking plenty of water may help. Contact your care team if the problem does not go away or is severe. What side effects may I notice from receiving this medication? Side effects that you should report to your care team as soon as possible: Allergic reactions--skin rash, itching, hives, swelling of the face, lips, tongue, or throat Bleeding--bloody or black, tar-like stools, red or dark brown urine, vomiting blood or brown material that looks like coffee grounds, small red or purple spots on skin, unusual bleeding or bruising Heart rhythm changes--fast or irregular heartbeat, dizziness, feeling faint or lightheaded, chest pain, trouble breathing Low sodium level--muscle weakness, fatigue, dizziness,  headache, confusion Serotonin syndrome--irritability, confusion, fast or irregular heartbeat, muscle stiffness, twitching muscles, sweating, high fever, seizure, chills, vomiting, diarrhea Sudden eye pain or change in vision such as blurred vision, seeing halos around lights, vision loss Thoughts of suicide or self-harm, worsening mood Side effects that usually do not require medical attention (report these to your care team if they continue or are bothersome): Change in sex drive or performance Diarrhea Excessive sweating Nausea Tremors or shaking Upset stomach This list may not describe all possible side effects. Call your doctor for medical advice about side effects. You may report side effects to FDA at 1-800-FDA-1088. Where should I keep my medication? Keep out of the reach of children and pets. Store at room temperature between 20 and 25 degrees C (68 and 77 degrees F). Get rid of any unused medication after the expiration date. To get rid of medications that are no longer needed or expired: Take the medication to a medication take-back program. Check with your pharmacy or law enforcement to find a location. If you cannot return the medication, check the label or package insert to see if the medication should be thrown out in the garbage or flushed down the toilet. If you are not sure, ask your care team. If it is safe to put in the trash, empty the medication out of the container. Mix the medication with cat litter, dirt, coffee grounds, or other unwanted substance. Seal the mixture in a bag or container. Put it in the trash. NOTE: This sheet is a summary. It may not cover all possible information. If you have questions about this medicine, talk to your doctor, pharmacist, or health care provider.  2024 Elsevier/Gold Standard (2022-01-19 00:00:00)

## 2023-08-03 NOTE — Progress Notes (Unsigned)
BH MD OP Progress Note  08/03/2023 2:03 PM Jessica Guerrero  MRN:  [id number]  Chief Complaint:  Chief Complaint  Patient presents with   Follow-up   Depression   Anxiety   Medication Refill   HPI: Jessica Guerrero is a 23 year old African-American female, employed, single, lives in [place], has a history of episodic mood disorder, hidradenitis suppurativa, episodic cannabis use was evaluated in office today.  The patient is a 23 year old female who presents with mood symptoms.  She experiences mood swings, depression, concentration problems, irritability, and anxiety, which have been present since middle school. These symptoms are particularly severe before her menstrual period, during which she feels 'rock bottom' regardless of medication use. She has a history of self-harm during these periods. Despite not taking her prescribed medications, Abilify and Lexapro, since the end of December, she feels better over the last two weeks, attributing this to a raise at work and regular gym attendance. However, she acknowledges being in a bad mood today, attributing it to premenstrual symptoms.  She describes significant premenstrual symptoms, including mood disturbances and physical symptoms such as bloating, breast tenderness, abdominal pain, and nausea. The first two days of her period are particularly difficult, as she cannot eat without vomiting. These symptoms affect her ability to work, and she often seeks coverage for her shifts during this time.  This has been going on for several years and progressively getting worse.  She has been attending therapy sessions with Inetta Fermo, her therapist, approximately two to four times a month. However, due to the holidays and her therapist's vacation, there was a longer gap between sessions recently.  She works primarily morning shifts, requiring her to wake up at 3 AM. She attempts to get eight hours of sleep but often only manages six, sometimes  sleeping up to fourteen hours. She has recently started going to the gym almost daily, which she feels has been beneficial. She has not used cannabis recently, describing life as 'boring' without it, but she is trying to find other ways to cope, such as going to the gym.  Patient currently denies any suicidality, homicidality or perceptual disturbances.   Visit Diagnosis:    ICD-10-CM   1. Premenstrual dysphoric disorder  F32.81 sertraline (ZOLOFT) 25 MG tablet    2. GAD (generalized anxiety disorder)  F41.1 sertraline (ZOLOFT) 25 MG tablet    3. Cannabis use, unspecified, uncomplicated  F12.90       Past Psychiatric History: I have reviewed past psychiatric history from progress note on 01/07/2023.  Past trials of Lexapro, BuSpar.  Patient does report a history of possible overdose when she was in middle school when she took a couple of ibuprofen.  Patient with history of self-injurious behaviors.  Past Medical History:  Past Medical History:  Diagnosis Date   Depression    Hydradenitis    History reviewed. No pertinent surgical history.  Family Psychiatric History: I have reviewed family psychiatric history from progress note on 01/07/2023.  Family History:  Family History  Problem Relation Age of Onset   Alcohol abuse Mother    Diabetes Mother    Bipolar disorder Mother    Cirrhosis Mother    Autism Brother    Drug abuse Maternal Grandfather    Drug abuse Maternal Grandmother    Depression Maternal Grandmother    Diabetes Maternal Grandmother    Hyperlipidemia Maternal Grandmother    COPD Paternal Grandmother    Diabetes Paternal Grandmother    Hyperlipidemia Paternal Grandmother  Heart failure Paternal Grandmother     Social History: I have reviewed social history from progress note on 01/07/2023. Social History   Socioeconomic History   Marital status: Single    Spouse name: Not on file   Number of children: 0   Years of education: 12   Highest education level:  High school graduate  Occupational History   Occupation: shift lead    Comment: [employer]  Tobacco Use   Smoking status: Never   Smokeless tobacco: Never  Vaping Use   Vaping status: Every Day   Substances: Nicotine, Flavoring  Substance and Sexual Activity   Alcohol use: Never   Drug use: Yes    Types: Marijuana    Comment: daily use   Sexual activity: Not Currently  Other Topics Concern   Not on file  Social History Narrative   Not on file   Social Drivers of Health   Financial Resource Strain: Low Risk  (11/02/2022)   Overall Financial Resource Strain (CARDIA)    Difficulty of Paying Living Expenses: Not hard at all  Food Insecurity: No Food Insecurity (11/02/2022)   Hunger Vital Sign    Worried About Running Out of Food in the Last Year: Never true    Ran Out of Food in the Last Year: Never true  Transportation Needs: No Transportation Needs (11/02/2022)   PRAPARE - Administrator, Civil Service (Medical): No    Lack of Transportation (Non-Medical): No  Physical Activity: Inactive (11/02/2022)   Exercise Vital Sign    Days of Exercise per Week: 0 days    Minutes of Exercise per Session: 0 min  Stress: Stress Concern Present (11/02/2022)   [employer] of Occupational Health - Occupational Stress Questionnaire    Feeling of Stress : Very much  Social Connections: Socially Isolated (11/02/2022)   Social Connection and Isolation Panel [NHANES]    Frequency of Communication with Friends and Family: More than three times a week    Frequency of Social Gatherings with Friends and Family: More than three times a week    Attends Religious Services: Never    Database administrator or Organizations: No    Attends Engineer, structural: Never    Marital Status: Separated    Allergies: No Known Allergies  Metabolic Disorder Labs: Lab Results  Component Value Date   HGBA1C 5.2 10/22/2022   MPG 103 10/22/2022   Lab Results  Component Value Date    PROLACTIN 12.3 10/22/2022   Lab Results  Component Value Date   CHOL 175 (H) 10/22/2022   TRIG 73 10/22/2022   HDL 62 10/22/2022   CHOLHDL 2.8 10/22/2022   LDLCALC 97 10/22/2022   Lab Results  Component Value Date   TSH 0.80 10/22/2022    Therapeutic Level Labs: No results found for: "LITHIUM" No results found for: "VALPROATE" No results found for: "CBMZ"  Current Medications: Current Outpatient Medications  Medication Sig Dispense Refill   sertraline (ZOLOFT) 25 MG tablet Take 1 tablet (25 mg total) by mouth daily with breakfast. 30 tablet 1   No current facility-administered medications for this visit.     Musculoskeletal: Strength & Muscle Tone: within normal limits Gait & Station: normal Patient leans: N/A  Psychiatric Specialty Exam: Review of Systems  Psychiatric/Behavioral:  Positive for dysphoric mood and sleep disturbance. The patient is nervous/anxious.     Blood pressure 122/86, pulse 97, temperature 98.7 F (37.1 C), temperature source Temporal, height 5' 3.5" (1.613 m), weight  196 lb 9.6 oz (89.2 kg), last menstrual period 07/06/2023, SpO2 100%.Body mass index is 34.28 kg/m.  General Appearance: Casual  Eye Contact:  Fair  Speech:  Clear and Coherent  Volume:  Normal  Mood:  Anxious and Depressed  Affect:  Congruent  Thought Process:  Goal Directed and Descriptions of Associations: Intact  Orientation:  Full (Time, Place, and Person)  Thought Content: Logical   Suicidal Thoughts:  No  Homicidal Thoughts:  No  Memory:  Immediate;   Fair Recent;   Fair Remote;   Fair  Judgement:  Fair  Insight:  Fair  Psychomotor Activity:  Normal  Concentration:  Concentration: Fair and Attention Span: Fair  Recall:  [employer] of Knowledge: Fair  Language: Fair  Akathisia:  No  Handed:  Right  AIMS (if indicated): not done  Assets:  Desire for Improvement Housing Social Support  ADL's:  Intact  Cognition: WNL  Sleep:   Restless due to shift at work    Screenings: GAD-7    Garment/textile technologist Visit from 02/17/2023 in [facility] Video Visit from 01/07/2023 in [facility] Office Visit from 12/14/2022 in [facility] Office Visit from 11[facility]16/2022 in [facility] Office Visit from 11/02/2022 in [facility]  Total GAD-7 Score 10 9 6 10 13       PHQ2-9    Flowsheet Row Video Visit from 05/30/2023 in [facility] Video Visit from 04/22/2023 in [facility] Office Visit from 02/17/2023 in [facility] Video Visit from 01/07/2023 in [facility] Office Visit from 12/14/2022 in [facility]  PHQ-2 Total Score 2 2 4 4 4   PHQ-9 Total Score 5 7 11 16 13       Flowsheet Row Office Visit from 08/03/2023 in [facility] Video Visit from 05/30/2023 in [facility] Video Visit from 04/22/2023 in [facility]  C-SSRS RISK CATEGORY No Risk No Risk No Risk        Assessment and Plan: Jessica Guerrero is a 23 year old African-American female, single, employed, lives in [place], has a history of depression, anxiety, presented for a follow-up appointment with concerns about premenstrual mood symptoms, discussed assessment and plan as noted below.  Premenstrual Dysphoric Disorder (PMDD)-unstable Severe mood swings, depression, irritability, and physical symptoms (bloating, breast tenderness, abdominal pain) impairing functioning, particularly premenstrually. Symptoms persistent since middle school, not controlled by escitalopram, with gastrointestinal discomfort. Discussed SSRIs and hormonal birth  control, including sertraline's potential side effects (gastrointestinal issues, weight gain, sexual side effects).   - Start sertraline (Zoloft) at 25 mg daily, max 200 mg.   - Take medication with food to minimize gastrointestinal side effects.   - Refer to OBGYN for hormonal evaluation and birth control consideration.    Generalized anxiety disorder Anxiety symptoms symptoms (mood swings, concentration problems, irritability). Off Abilify and Lexapro since December, feeling better possibly due to lifestyle changes (financial security, regular exercise). Discussed medication compliance and sertraline's side effects.   - Continue sertraline (Zoloft) for depressive symptoms.   - Encourage medication compliance with an alarm.   - Follow-up in four weeks to assess response.   - Continue CBT   Cannabis use per history currently in remission Patient currently denies any use. - Encouraged to remain sober.  Follow-up   - Follow-up in four weeks, in person or via video. Collaboration of Care: Collaboration of Care: Referral or follow-up with counselor/therapist AEB patient encouraged to continue CBT with Ms. Felecia Jan will coordinate care.  Patient/Guardian was advised Release of Information must be obtained prior to any record release in order to collaborate their care with an outside provider. Patient/Guardian was advised if they have not already done so to contact the registration department to sign all necessary forms in order for [place] to release information regarding their care.   Consent: Patient/Guardian gives verbal consent for treatment and assignment of benefits for services provided during this visit. Patient/Guardian expressed understanding and agreed to proceed.   This note was generated in part or whole with voice recognition software. Voice recognition is usually quite accurate but there are transcription errors that can and very often do occur. I apologize for any typographical  errors that were not detected and corrected.    Jomarie Longs, MD 08/03/2023, 2:03 PM

## 2023-09-27 ENCOUNTER — Telehealth: Payer: Self-pay | Admitting: Psychiatry

## 2023-09-27 ENCOUNTER — Other Ambulatory Visit: Payer: Self-pay | Admitting: Psychiatry

## 2023-09-27 DIAGNOSIS — F411 Generalized anxiety disorder: Secondary | ICD-10-CM

## 2023-09-27 DIAGNOSIS — F3281 Premenstrual dysphoric disorder: Secondary | ICD-10-CM

## 2023-09-27 NOTE — Telephone Encounter (Signed)
Either ok.

## 2023-09-27 NOTE — Telephone Encounter (Signed)
I do not see an appointment scheduled for follow up.

## 2023-11-04 ENCOUNTER — Telehealth: Payer: Self-pay | Admitting: Psychiatry

## 2023-11-04 DIAGNOSIS — F411 Generalized anxiety disorder: Secondary | ICD-10-CM

## 2023-11-04 DIAGNOSIS — F3281 Premenstrual dysphoric disorder: Secondary | ICD-10-CM

## 2023-11-04 MED ORDER — SERTRALINE HCL 25 MG PO TABS: 25.0000 mg | ORAL_TABLET | Freq: Every day | ORAL | 0 refills | Status: DC

## 2023-11-04 NOTE — Telephone Encounter (Signed)
 I do not see an appointment scheduled for this patient.  Will need an appointment prior to future refills.

## 2023-11-23 ENCOUNTER — Telehealth: Payer: Self-pay | Admitting: Psychiatry

## 2023-11-23 DIAGNOSIS — F411 Generalized anxiety disorder: Secondary | ICD-10-CM

## 2023-11-23 DIAGNOSIS — F3281 Premenstrual dysphoric disorder: Secondary | ICD-10-CM

## 2023-11-23 MED ORDER — SERTRALINE HCL 25 MG PO TABS: 25.0000 mg | ORAL_TABLET | Freq: Every day | ORAL | 0 refills | Status: DC

## 2023-11-23 NOTE — Telephone Encounter (Signed)
 Patient called to schedule her appointment. Did not realize we had been calling her. She is scheduled for 12-13-23 but will need a refill on the Zoloft  25 mg

## 2023-11-23 NOTE — Telephone Encounter (Signed)
 I have sent sertraline  to pharmacy.  Patient needs to keep her appointment for future refills.

## 2023-11-25 NOTE — Telephone Encounter (Signed)
 Called patient to make aware of medication being sent to pharmacy no answer unable to leave a voicemail phone continued to ring

## 2023-12-12 NOTE — Progress Notes (Unsigned)
 [facility] MD/PA/NP OP Progress Note  12/12/2023 7:44 PM Jessica Guerrero  MRN:  [id number]  Chief Complaint: No chief complaint on file.  HPI: *** Visit Diagnosis: No diagnosis found.  Past Psychiatric History: I have reviewed past psychiatric history from progress note on 01/07/2023.  Past trials of Lexapro , BuSpar .  Patient reports a history of possible overdose when she was in middle school when she took a couple of ibuprofen.  Denies self-injurious behaviors.  Past Medical History:  Past Medical History:  Diagnosis Date   Depression    Hydradenitis    No past surgical history on file.  Family Psychiatric History: I have reviewed family psychiatric history from progress note on 01/07/2023.  Family History:  Family History  Problem Relation Age of Onset   Alcohol abuse Mother    Diabetes Mother    Bipolar disorder Mother    Cirrhosis Mother    Autism Brother    Drug abuse Maternal Grandfather    Drug abuse Maternal Grandmother    Depression Maternal Grandmother    Diabetes Maternal Grandmother    Hyperlipidemia Maternal Grandmother    COPD Paternal Grandmother    Diabetes Paternal Grandmother    Hyperlipidemia Paternal Grandmother    Heart failure Paternal Grandmother     Social History: I have reviewed social history from progress note on 01/07/2023. Social History   Socioeconomic History   Marital status: Single    Spouse name: Not on file   Number of children: 0   Years of education: 12   Highest education level: High school graduate  Occupational History   Occupation: shift lead    Comment: [employer]  Tobacco Use   Smoking status: Never   Smokeless tobacco: Never  Vaping Use   Vaping status: Every Day   Substances: Nicotine, Flavoring  Substance and Sexual Activity   Alcohol use: Never   Drug use: Yes    Types: Marijuana    Comment: daily use   Sexual activity: Not Currently  Other Topics Concern   Not on file  Social History Narrative   Not on file    Social Drivers of Health   Financial Resource Strain: Low Risk  (11/02/2022)   Overall Financial Resource Strain (CARDIA)    Difficulty of Paying Living Expenses: Not hard at all  Food Insecurity: No Food Insecurity (11/02/2022)   Hunger Vital Sign    Worried About Running Out of Food in the Last Year: Never true    Ran Out of Food in the Last Year: Never true  Transportation Needs: No Transportation Needs (11/02/2022)   PRAPARE - Administrator, Civil Service (Medical): No    Lack of Transportation (Non-Medical): No  Physical Activity: Inactive (11/02/2022)   Exercise Vital Sign    Days of Exercise per Week: 0 days    Minutes of Exercise per Session: 0 min  Stress: Stress Concern Present (11/02/2022)   [employer] of Occupational Health - Occupational Stress Questionnaire    Feeling of Stress : Very much  Social Connections: Socially Isolated (11/02/2022)   Social Connection and Isolation Panel [NHANES]    Frequency of Communication with Friends and Family: More than three times a week    Frequency of Social Gatherings with Friends and Family: More than three times a week    Attends Religious Services: Never    Database administrator or Organizations: No    Attends Banker Meetings: Never    Marital Status: Separated  Allergies: No Known Allergies  Metabolic Disorder Labs: Lab Results  Component Value Date   HGBA1C 5.2 10/22/2022   MPG 103 10/22/2022   Lab Results  Component Value Date   PROLACTIN 12.3 10/22/2022   Lab Results  Component Value Date   CHOL 175 (H) 10/22/2022   TRIG 73 10/22/2022   HDL 62 10/22/2022   CHOLHDL 2.8 10/22/2022   LDLCALC 97 10/22/2022   Lab Results  Component Value Date   TSH 0.80 10/22/2022    Therapeutic Level Labs: No results found for: "LITHIUM" No results found for: "VALPROATE" No results found for: "CBMZ"  Current Medications: Current Outpatient Medications  Medication Sig Dispense Refill    sertraline  (ZOLOFT ) 25 MG tablet Take 1 tablet (25 mg total) by mouth daily with breakfast. 30 tablet 0   No current facility-administered medications for this visit.     Musculoskeletal: Strength & Muscle Tone: within normal limits Gait & Station: normal Patient leans: N/A  Psychiatric Specialty Exam: Review of Systems  There were no vitals taken for this visit.There is no height or weight on file to calculate BMI.  General Appearance: Fairly Groomed  Eye Contact:  Fair  Speech:  Clear and Coherent  Volume:  Normal  Mood:  Anxious and Depressed  Affect:  Congruent  Thought Process:  Goal Directed and Descriptions of Associations: Intact  Orientation:  Full (Time, Place, and Person)  Thought Content: Logical   Suicidal Thoughts:  No  Homicidal Thoughts:  No  Memory:  Immediate;   Fair Recent;   Fair Remote;   Fair  Judgement:  Fair  Insight:  Fair  Psychomotor Activity:  Normal  Concentration:  Concentration: Fair and Attention Span: Fair  Recall:  [employer] of Knowledge: Fair  Language: Fair  Akathisia:  No  Handed:  Right  AIMS (if indicated): done  Assets:  Communication Skills Desire for Improvement Housing Social Support Talents/Skills Transportation  ADL's:  Intact  Cognition: WNL  Sleep:  Fair   Screenings: GAD-7    Garment/textile technologist Visit from 02/17/2023 in [facility] Video Visit from 01/07/2023 in [facility] Office Visit from 12/14/2022 in [facility] Visit from 11/16/2022 in [facility] Office Visit from 11/02/2022 in [facility]  Total GAD-7 Score 10 9 6 10 13       PHQ2-9    Flowsheet Row Video Visit from 05/30/2023 in [facility] Video Visit from 04/22/2023 in [facility] Office Visit from 02/17/2023  in [facility] Video Visit from 01/07/2023 in [facility] Office Visit from 12/14/2022 in [facility]  PHQ-2 Total Score 2 2 4 4 4   PHQ-9 Total Score 5 7 11 16 13       Flowsheet Row Office Visit from 08/03/2023 in [facility] Video Visit from 05/30/2023 in [facility] Video Visit from 04/22/2023 in [facility]  C-SSRS RISK CATEGORY No Risk No Risk No Risk        Assessment and Plan: Jessica Guerrero is a 23 year old African-American female, single, employed, lives in [place], has a history of depression, anxiety was evaluated in office today.  Premenstrual dysphoric disorder-improving Currently reports overall improved on the current medication regimen. Continue sertraline  25 mg daily   Generalized  anxiety disorder-improving Anxiety symptoms improving on the current medication regimen. Continue sertraline  25 mg daily Continue follow-up with Ms. Quirino Buckles for CBT  Cannabis use in remission Currently denies any use Encouraged abstinence.  Follow-up Follow-up in clinic in 1 month or sooner if needed.  Collaboration of Care: Collaboration of Care: {[facility] OP Collaboration of Care:[id number]}  Patient/Guardian was advised Release of Information must be obtained prior to any record release in order to collaborate their care with an outside provider. Patient/Guardian was advised if they have not already done so to contact the registration department to sign all necessary forms in order for us  to release information regarding their care.   Consent: Patient/Guardian gives verbal consent for treatment and assignment of benefits for services provided during this visit. Patient/Guardian expressed understanding and agreed to proceed.    Dajon Lazar,  MD 12/12/2023, 7:44 PM

## 2023-12-13 ENCOUNTER — Encounter: Payer: Self-pay | Admitting: Psychiatry

## 2023-12-13 ENCOUNTER — Ambulatory Visit: Admitting: Psychiatry

## 2023-12-13 ENCOUNTER — Other Ambulatory Visit: Payer: Self-pay

## 2023-12-13 VITALS — BP 113/81 | HR 76 | Temp 97.7°F | Ht 63.5 in | Wt 196.0 lb

## 2023-12-13 DIAGNOSIS — F411 Generalized anxiety disorder: Secondary | ICD-10-CM

## 2023-12-13 DIAGNOSIS — F3281 Premenstrual dysphoric disorder: Secondary | ICD-10-CM | POA: Diagnosis not present

## 2023-12-13 DIAGNOSIS — F1291 Cannabis use, unspecified, in remission: Secondary | ICD-10-CM | POA: Insufficient documentation

## 2023-12-13 MED ORDER — SERTRALINE HCL 50 MG PO TABS: 50.0000 mg | ORAL_TABLET | Freq: Every day | ORAL | 1 refills | Status: DC

## 2023-12-13 NOTE — Patient Instructions (Signed)
 Insomnia Insomnia is a sleep disorder that makes it difficult to fall asleep or stay asleep. Insomnia can cause fatigue, low energy, difficulty concentrating, mood swings, and poor performance at work or school. There are three different ways to classify insomnia: Difficulty falling asleep. Difficulty staying asleep. Waking up too early in the morning. Any type of insomnia can be long-term (chronic) or short-term (acute). Both are common. Short-term insomnia usually lasts for 3 months or less. Chronic insomnia occurs at least three times a week for longer than 3 months. What are the causes? Insomnia may be caused by another condition, situation, or substance, such as: Having certain mental health conditions, such as anxiety and depression. Using caffeine, alcohol, tobacco, or drugs. Having gastrointestinal conditions, such as gastroesophageal reflux disease (GERD). Having certain medical conditions. These include: Asthma. Alzheimer's disease. Stroke. Chronic pain. An overactive thyroid gland (hyperthyroidism). Other sleep disorders, such as restless legs syndrome and sleep apnea. Menopause. Sometimes, the cause of insomnia may not be known. What increases the risk? Risk factors for insomnia include: Gender. Females are affected more often than males. Age. Insomnia is more common as people get older. Stress and certain medical and mental health conditions. Lack of exercise. Having an irregular work schedule. This may include working night shifts and traveling between different time zones. What are the signs or symptoms? If you have insomnia, the main symptom is having trouble falling asleep or having trouble staying asleep. This may lead to other symptoms, such as: Feeling tired or having low energy. Feeling nervous about going to sleep. Not feeling rested in the morning. Having trouble concentrating. Feeling irritable, anxious, or depressed. How is this diagnosed? This condition  may be diagnosed based on: Your symptoms and medical history. Your health care provider may ask about: Your sleep habits. Any medical conditions you have. Your mental health. A physical exam. How is this treated? Treatment for insomnia depends on the cause. Treatment may focus on treating an underlying condition that is causing the insomnia. Treatment may also include: Medicines to help you sleep. Counseling or therapy. Lifestyle adjustments to help you sleep better. Follow these instructions at home: Eating and drinking  Limit or avoid alcohol, caffeinated beverages, and products that contain nicotine and tobacco, especially close to bedtime. These can disrupt your sleep. Do not eat a large meal or eat spicy foods right before bedtime. This can lead to digestive discomfort that can make it hard for you to sleep. Sleep habits  Keep a sleep diary to help you and your health care provider figure out what could be causing your insomnia. Write down: When you sleep. When you wake up during the night. How well you sleep and how rested you feel the next day. Any side effects of medicines you are taking. What you eat and drink. Make your bedroom a dark, comfortable place where it is easy to fall asleep. Put up shades or blackout curtains to block light from outside. Use a white noise machine to block noise. Keep the temperature cool. Limit screen use before bedtime. This includes: Not watching TV. Not using your smartphone, tablet, or computer. Stick to a routine that includes going to bed and waking up at the same times every day and night. This can help you fall asleep faster. Consider making a quiet activity, such as reading, part of your nighttime routine. Try to avoid taking naps during the day so that you sleep better at night. Get out of bed if you are still awake after  15 minutes of trying to sleep. Keep the lights down, but try reading or doing a quiet activity. When you feel  sleepy, go back to bed. General instructions Take over-the-counter and prescription medicines only as told by your health care provider. Exercise regularly as told by your health care provider. However, avoid exercising in the hours right before bedtime. Use relaxation techniques to manage stress. Ask your health care provider to suggest some techniques that may work well for you. These may include: Breathing exercises. Routines to release muscle tension. Visualizing peaceful scenes. Make sure that you drive carefully. Do not drive if you feel very sleepy. Keep all follow-up visits. This is important. Contact a health care provider if: You are tired throughout the day. You have trouble in your daily routine due to sleepiness. You continue to have sleep problems, or your sleep problems get worse. Get help right away if: You have thoughts about hurting yourself or someone else. Get help right away if you feel like you may hurt yourself or others, or have thoughts about taking your own life. Go to your nearest emergency room or: Call 911. Call the National Suicide Prevention Lifeline at (906)021-1611 or 988. This is open 24 hours a day. Text the Crisis Text Line at 325-069-2793. Summary Insomnia is a sleep disorder that makes it difficult to fall asleep or stay asleep. Insomnia can be long-term (chronic) or short-term (acute). Treatment for insomnia depends on the cause. Treatment may focus on treating an underlying condition that is causing the insomnia. Keep a sleep diary to help you and your health care provider figure out what could be causing your insomnia. This information is not intended to replace advice given to you by your health care provider. Make sure you discuss any questions you have with your health care provider. Document Revised: 06/01/2021 Document Reviewed: 06/01/2021 Elsevier Patient Education  2024 [employer].

## 2024-02-21 ENCOUNTER — Ambulatory Visit: Admitting: Psychiatry

## 2024-03-01 ENCOUNTER — Encounter: Payer: Self-pay | Admitting: Psychiatry

## 2024-03-01 ENCOUNTER — Ambulatory Visit: Admitting: Psychiatry

## 2024-03-01 VITALS — BP 118/80 | HR 97 | Temp 98.9°F | Ht 63.5 in | Wt 203.2 lb

## 2024-03-01 DIAGNOSIS — F3281 Premenstrual dysphoric disorder: Secondary | ICD-10-CM | POA: Diagnosis not present

## 2024-03-01 DIAGNOSIS — F1291 Cannabis use, unspecified, in remission: Secondary | ICD-10-CM | POA: Diagnosis not present

## 2024-03-01 DIAGNOSIS — F411 Generalized anxiety disorder: Secondary | ICD-10-CM

## 2024-03-01 MED ORDER — SERTRALINE HCL 50 MG PO TABS: 50.0000 mg | ORAL_TABLET | Freq: Every day | ORAL | 0 refills | Status: AC

## 2024-03-01 NOTE — Progress Notes (Signed)
 [facility] MD OP Progress Note  03/01/2024 10:51 AM Jessica Guerrero  MRN:  [id number]  Chief Complaint:  Chief Complaint  Patient presents with   Follow-up   Depression   Anxiety   Medication Refill   Discussed the use of AI scribe software for clinical note transcription with the patient, who gave verbal consent to proceed.  History of Present Illness Jessica Guerrero is a 23 year old female, employed, single, lives in [place], has a history of premenstrual dysphoric disorder, generalized anxiety disorder was evaluated in office today for a follow-up appointment.  She reports that her mood symptoms have remained stable since her last visit in June. Describing her daily routine, she notes feeling productive and continues to engage in regular gym activity, which she finds beneficial for her well-being. She reports that her sleep remains inconsistent, and she relates this to her irregular work schedule. She states that her eating pattern consists of 1 large meal per day, with occasional attempts to add a smaller meal or snack during late morning hours, but she states this pattern has not changed significantly. She does not eat breakfast due to early work hours and often skips meals at work because she stays busy. Throughout the day, she drinks water and occasionally has a cold brew in the morning and matcha in the afternoon.  She reports that she continues ongoing work in individual therapy with her therapist, approximately every 2 weeks. She focuses on emotional regulation, specifically addressing her tendency to idealize people and place high expectations on them, which leads to disappointment when those expectations are not met. She describes reacting strongly to initial emotions and works on strategies to manage these reactions. She shares that recent romantic interests have provided examples where she quickly developed strong feelings and high expectations, later recognizing the need to  ground herself in reality. She states that she is not currently dating, as she feels she is still healing and wants to avoid unhealthy patterns of detachment from others and over-focusing on romantic interests. Her therapist incorporates DBT skills and somatic therapy techniques, which she finds helpful, particularly in identifying and processing her emotions.  She confirms that she is currently taking sertraline  50 mg daily, which was increased at her last visit. She reports that the medication regimen has been solid and that she is nearly out of her current supply. She notes that after the dose increase, she noticed excessive sweating. She notes that the sweating varies depending on her activity and environment but does not bother her enough to consider stopping the medication. She manages the side effect by planning ahead with clothing and hydration.  She denies any current suicidality, homicidality or perceptual disturbances.   Visit Diagnosis:    ICD-10-CM   1. Premenstrual dysphoric disorder  F32.81 sertraline  (ZOLOFT ) 50 MG tablet    2. GAD (generalized anxiety disorder)  F41.1 sertraline  (ZOLOFT ) 50 MG tablet    3. Cannabis use, unspecified, in remission  F12.91       Past Psychiatric History: I had reviewed past psychiatric history from progress note on 01/07/2023.  Past trials of Lexapro , BuSpar .  Patient reports a history of possible overdose when she was in middle school when she took a couple of ibuprofen.  Denies self-injurious behaviors.  Past Medical History:  Past Medical History:  Diagnosis Date   Depression    Hydradenitis    History reviewed. No pertinent surgical history.  Family Psychiatric History: I have reviewed family psychiatric history from progress note on 01/07/2023.  Family History:  Family History  Problem Relation Age of Onset   Alcohol abuse Mother    Diabetes Mother    Bipolar disorder Mother    Cirrhosis Mother    Autism Brother    Drug abuse  Maternal Grandfather    Drug abuse Maternal Grandmother    Depression Maternal Grandmother    Diabetes Maternal Grandmother    Hyperlipidemia Maternal Grandmother    COPD Paternal Grandmother    Diabetes Paternal Grandmother    Hyperlipidemia Paternal Grandmother    Heart failure Paternal Grandmother     Social History: I have reviewed social history from progress note on 01/07/2023. Social History   Socioeconomic History   Marital status: Single    Spouse name: Not on file   Number of children: 0   Years of education: 12   Highest education level: High school graduate  Occupational History   Occupation: shift lead    Comment: [employer]  Tobacco Use   Smoking status: Never   Smokeless tobacco: Never  Vaping Use   Vaping status: Every Day   Substances: Nicotine, Flavoring  Substance and Sexual Activity   Alcohol use: Never   Drug use: Yes    Types: Marijuana    Comment: daily use   Sexual activity: Not Currently  Other Topics Concern   Not on file  Social History Narrative   Not on file   Social Drivers of Health   Financial Resource Strain: Low Risk  (11/02/2022)   Overall Financial Resource Strain (CARDIA)    Difficulty of Paying Living Expenses: Not hard at all  Food Insecurity: No Food Insecurity (11/02/2022)   Hunger Vital Sign    Worried About Running Out of Food in the Last Year: Never true    Ran Out of Food in the Last Year: Never true  Transportation Needs: No Transportation Needs (11/02/2022)   PRAPARE - Administrator, Civil Service (Medical): No    Lack of Transportation (Non-Medical): No  Physical Activity: Inactive (11/02/2022)   Exercise Vital Sign    Days of Exercise per Week: 0 days    Minutes of Exercise per Session: 0 min  Stress: Stress Concern Present (11/02/2022)   [employer] of Occupational Health - Occupational Stress Questionnaire    Feeling of Stress : Very much  Social Connections: Socially Isolated (11/02/2022)    Social Connection and Isolation Panel    Frequency of Communication with Friends and Family: More than three times a week    Frequency of Social Gatherings with Friends and Family: More than three times a week    Attends Religious Services: Never    Database administrator or Organizations: No    Attends Engineer, structural: Never    Marital Status: Separated    Allergies: No Known Allergies  Metabolic Disorder Labs: Lab Results  Component Value Date   HGBA1C 5.2 10/22/2022   MPG 103 10/22/2022   Lab Results  Component Value Date   PROLACTIN 12.3 10/22/2022   Lab Results  Component Value Date   CHOL 175 (H) 10/22/2022   TRIG 73 10/22/2022   HDL 62 10/22/2022   CHOLHDL 2.8 10/22/2022   LDLCALC 97 10/22/2022   Lab Results  Component Value Date   TSH 0.80 10/22/2022    Therapeutic Level Labs: No results found for: LITHIUM No results found for: VALPROATE No results found for: CBMZ  Current Medications: Current Outpatient Medications  Medication Sig Dispense Refill   sertraline  (ZOLOFT )  50 MG tablet Take 1 tablet (50 mg total) by mouth daily. 90 tablet 0   No current facility-administered medications for this visit.     Musculoskeletal: Strength & Muscle Tone: within normal limits Gait & Station: normal Patient leans: N/A  Psychiatric Specialty Exam: Review of Systems  Psychiatric/Behavioral:  Positive for sleep disturbance.     Blood pressure 118/80, pulse 97, temperature 98.9 F (37.2 C), temperature source Temporal, height 5' 3.5 (1.613 m), weight 203 lb 3.2 oz (92.2 kg), SpO2 99%.Body mass index is 35.43 kg/m.  General Appearance: Fairly Groomed  Eye Contact:  Fair  Speech:  Clear and Coherent  Volume:  Normal  Mood:  Euthymic  Affect:  Congruent  Thought Process:  Goal Directed and Descriptions of Associations: Intact  Orientation:  Full (Time, Place, and Person)  Thought Content: Logical   Suicidal Thoughts:  No  Homicidal  Thoughts:  No  Memory:  Immediate;   Fair Recent;   Fair Remote;   Fair  Judgement:  Fair  Insight:  Fair  Psychomotor Activity:  Normal  Concentration:  Concentration: Fair and Attention Span: Fair  Recall:  [employer] of Knowledge: Fair  Language: Fair  Akathisia:  No  Handed:  Right  AIMS (if indicated): not done  Assets:  Communication Skills Desire for Improvement Housing Social Support Talents/Skills Transportation  ADL's:  Intact  Cognition: WNL  Sleep:  Varies due to her schedule changes at work   Screenings: GAD-7    Garment/textile technologist Visit from 03/01/2024 in [facility] Office Visit from 12/13/2023 in [facility] Office Visit from 02/17/2023 in [facility] Video Visit from 01/07/2023 in [facility] Office Visit from 12/14/2022 in [facility]  Total GAD-7 Score 4 7 10 9 6    PHQ2-9    Flowsheet Row Office Visit from 03/01/2024 in [facility] Office Visit from 12/13/2023 in [facility] Video Visit from 05/30/2023 in [facility] Video Visit from 04/22/2023 in [facility] Office Visit from 02/17/2023 in [facility]  PHQ-2 Total Score 2 2 2 2 4   PHQ-9 Total Score 5 10 5 7 11    Flowsheet Row Office Visit from 03/01/2024 in [facility] Office Visit from 12/13/2023 in [facility] Office Visit from 08/03/2023 in [facility]  C-SSRS RISK CATEGORY No Risk Moderate Risk No Risk     Assessment and Plan: Jessica Guerrero is a 23 year old African-American female, single,  employed, lives in [place], has a history of depression, anxiety was evaluated in office today.  Discussed assessment and plan as noted below.  Premenstrual dysphoric disorder-improving Currently denies any significant mood lability depression or anxiety.  Sertraline  has been beneficial. Continue Sertraline  50 mg daily  Generalized anxiety disorder-improving Reports anxiety symptoms as manageable continues to work with therapist which has been beneficial as well. Continue Sertraline  50 mg daily Continue psychotherapy sessions with Ms. Ellouise Hummer.  Cannabis use in remission Currently denies use. Encourage abstinence.  Follow-up Follow-up in clinic in 3 months or sooner if needed.   Collaboration of Care: Collaboration of Care: Referral or follow-up with counselor/therapist AEB encouraged to continue psychotherapy sessions.  Patient/Guardian was advised Release of Information must be obtained prior to any record release  in order to collaborate their care with an outside provider. Patient/Guardian was advised if they have not already done so to contact the registration department to sign all necessary forms in order for us  to release information regarding their care.   Consent: Patient/Guardian gives verbal consent for treatment and assignment of benefits for services provided during this visit. Patient/Guardian expressed understanding and agreed to proceed.   This note was generated in part or whole with voice recognition software. Voice recognition is usually quite accurate but there are transcription errors that can and very often do occur. I apologize for any typographical errors that were not detected and corrected.    Careena Degraffenreid, MD 03/02/2024, 6:43 AM

## 2024-05-28 ENCOUNTER — Ambulatory Visit: Admitting: Psychiatry
